# Patient Record
Sex: Male | Born: 1955 | Race: Black or African American | Hispanic: No | Marital: Single | Smoking: Current some day smoker
Health system: Southern US, Community
[De-identification: ages and names within clinical notes are randomized; demographics above are authoritative.]

## PROBLEM LIST (undated history)

## (undated) DIAGNOSIS — K7030 Alcoholic cirrhosis of liver without ascites: Secondary | ICD-10-CM

## (undated) DIAGNOSIS — K649 Unspecified hemorrhoids: Secondary | ICD-10-CM

## (undated) DIAGNOSIS — D696 Thrombocytopenia, unspecified: Secondary | ICD-10-CM

## (undated) DIAGNOSIS — Z720 Tobacco use: Secondary | ICD-10-CM

## (undated) DIAGNOSIS — M1990 Unspecified osteoarthritis, unspecified site: Secondary | ICD-10-CM

## (undated) DIAGNOSIS — K922 Gastrointestinal hemorrhage, unspecified: Secondary | ICD-10-CM

---

## 2019-05-27 ENCOUNTER — Encounter: Payer: Self-pay | Admitting: Emergency Medicine

## 2019-05-27 ENCOUNTER — Emergency Department: Payer: Medicaid - Out of State

## 2019-05-27 ENCOUNTER — Other Ambulatory Visit: Payer: Self-pay

## 2019-05-27 ENCOUNTER — Observation Stay
Admission: EM | Admit: 2019-05-27 | Discharge: 2019-05-28 | Disposition: A | Discharge status: Home / Self Care | Payer: Medicaid - Out of State | Attending: Internal Medicine | Admitting: Internal Medicine

## 2019-05-27 DIAGNOSIS — Z20822 Contact with and (suspected) exposure to covid-19: Secondary | ICD-10-CM | POA: Diagnosis not present

## 2019-05-27 DIAGNOSIS — I10 Essential (primary) hypertension: Secondary | ICD-10-CM | POA: Insufficient documentation

## 2019-05-27 DIAGNOSIS — M5136 Other intervertebral disc degeneration, lumbar region: Secondary | ICD-10-CM | POA: Insufficient documentation

## 2019-05-27 DIAGNOSIS — I8510 Secondary esophageal varices without bleeding: Secondary | ICD-10-CM | POA: Insufficient documentation

## 2019-05-27 DIAGNOSIS — Z79899 Other long term (current) drug therapy: Secondary | ICD-10-CM | POA: Insufficient documentation

## 2019-05-27 DIAGNOSIS — K7030 Alcoholic cirrhosis of liver without ascites: Secondary | ICD-10-CM | POA: Diagnosis not present

## 2019-05-27 DIAGNOSIS — Z720 Tobacco use: Secondary | ICD-10-CM

## 2019-05-27 DIAGNOSIS — F17200 Nicotine dependence, unspecified, uncomplicated: Secondary | ICD-10-CM | POA: Insufficient documentation

## 2019-05-27 DIAGNOSIS — K648 Other hemorrhoids: Principal | ICD-10-CM | POA: Insufficient documentation

## 2019-05-27 DIAGNOSIS — K649 Unspecified hemorrhoids: Secondary | ICD-10-CM | POA: Diagnosis present

## 2019-05-27 DIAGNOSIS — K922 Gastrointestinal hemorrhage, unspecified: Secondary | ICD-10-CM | POA: Diagnosis not present

## 2019-05-27 DIAGNOSIS — R7401 Elevation of levels of liver transaminase levels: Secondary | ICD-10-CM | POA: Insufficient documentation

## 2019-05-27 DIAGNOSIS — I700 Atherosclerosis of aorta: Secondary | ICD-10-CM | POA: Diagnosis not present

## 2019-05-27 LAB — CBC
HCT: 40.1 % (ref 39.0–52.0)
Hemoglobin: 14 g/dL (ref 13.0–17.0)
MCH: 32.3 pg (ref 26.0–34.0)
MCHC: 34.9 g/dL (ref 30.0–36.0)
MCV: 92.6 fL (ref 80.0–100.0)
Platelets: 98 10*3/uL — ABNORMAL LOW (ref 150–400)
RBC: 4.33 MIL/uL (ref 4.22–5.81)
RDW: 16.1 % — ABNORMAL HIGH (ref 11.5–15.5)
WBC: 4.2 10*3/uL (ref 4.0–10.5)
nRBC: 0 % (ref 0.0–0.2)

## 2019-05-27 LAB — HEMOGLOBIN AND HEMATOCRIT, BLOOD
HCT: 39.4 % (ref 39.0–52.0)
Hemoglobin: 13.9 g/dL (ref 13.0–17.0)

## 2019-05-27 LAB — PROTIME-INR
INR: 1.2 (ref 0.8–1.2)
Prothrombin Time: 15.1 seconds (ref 11.4–15.2)

## 2019-05-27 LAB — COMPREHENSIVE METABOLIC PANEL
ALT: 58 U/L — ABNORMAL HIGH (ref 0–44)
AST: 100 U/L — ABNORMAL HIGH (ref 15–41)
Albumin: 3.2 g/dL — ABNORMAL LOW (ref 3.5–5.0)
Alkaline Phosphatase: 76 U/L (ref 38–126)
Anion gap: 6 (ref 5–15)
BUN: 11 mg/dL (ref 8–23)
CO2: 26 mmol/L (ref 22–32)
Calcium: 9.5 mg/dL (ref 8.9–10.3)
Chloride: 104 mmol/L (ref 98–111)
Creatinine, Ser: 0.99 mg/dL (ref 0.61–1.24)
GFR calc Af Amer: >60 mL/min (ref >60)
GFR calc non Af Amer: >60 mL/min (ref >60)
Glucose, Bld: 118 mg/dL — ABNORMAL HIGH (ref 70–99)
Potassium: 3.9 mmol/L (ref 3.5–5.1)
Sodium: 136 mmol/L (ref 135–145)
Total Bilirubin: 1.8 mg/dL — ABNORMAL HIGH (ref 0.3–1.2)
Total Protein: 8 g/dL (ref 6.5–8.1)

## 2019-05-27 LAB — TYPE AND SCREEN
ABO/RH(D): A POS
Antibody Screen: NEGATIVE

## 2019-05-27 MED ORDER — SODIUM CHLORIDE 0.9% FLUSH: 3.0000 mL | Freq: Two times a day (BID) | INTRAVENOUS | Status: DC

## 2019-05-27 MED ORDER — SODIUM CHLORIDE 0.9 % IV SOLN
1.0000 g | Freq: Once | INTRAVENOUS | Status: AC
  Administered 2019-05-27: 1 g via INTRAVENOUS
  Filled 2019-05-27: qty 10

## 2019-05-27 MED ORDER — ONDANSETRON HCL 4 MG/2ML IJ SOLN: 4.0000 mg | Freq: Four times a day (QID) | INTRAMUSCULAR | Status: DC | PRN

## 2019-05-27 MED ORDER — DOCUSATE SODIUM 100 MG PO CAPS: 100.0000 mg | ORAL_CAPSULE | Freq: Two times a day (BID) | ORAL | Status: DC

## 2019-05-27 MED ORDER — PANTOPRAZOLE SODIUM 40 MG IV SOLR: 40.0000 mg | Freq: Two times a day (BID) | INTRAVENOUS | Status: DC

## 2019-05-27 MED ORDER — SODIUM CHLORIDE 0.9 % IV SOLN
50.0000 ug/h | INTRAVENOUS | Status: DC
  Administered 2019-05-27 – 2019-05-28 (×2): 50 ug/h via INTRAVENOUS
  Filled 2019-05-27 (×8): qty 1

## 2019-05-27 MED ORDER — SODIUM CHLORIDE 0.9 % IV SOLN: INTRAVENOUS | Status: DC

## 2019-05-27 MED ORDER — PANTOPRAZOLE SODIUM 40 MG IV SOLR
40.0000 mg | Freq: Once | INTRAVENOUS | Status: AC
  Administered 2019-05-27: 40 mg via INTRAVENOUS
  Filled 2019-05-27: qty 40

## 2019-05-27 MED ORDER — IOHEXOL 300 MG/ML  SOLN
100.0000 mL | Freq: Once | INTRAMUSCULAR | Status: AC | PRN
  Administered 2019-05-27: 100 mL via INTRAVENOUS

## 2019-05-27 MED ORDER — BISACODYL 5 MG PO TBEC
5.0000 mg | DELAYED_RELEASE_TABLET | Freq: Every day | ORAL | Status: DC | PRN
  Administered 2019-05-27: 5 mg via ORAL
  Filled 2019-05-27: qty 1

## 2019-05-27 MED ORDER — ONDANSETRON HCL 4 MG PO TABS: 4.0000 mg | ORAL_TABLET | Freq: Four times a day (QID) | ORAL | Status: DC | PRN

## 2019-05-27 NOTE — H&P (Signed)
[place] at [facility]   PATIENT NAME: Roger Franco    MR#:  [id number]  DATE OF BIRTH:  1955-12-25  DATE OF ADMISSION:  05/27/2019  PRIMARY CARE PHYSICIAN: No primary care provider on file.   REQUESTING/REFERRING PHYSICIAN: Willy Eddy, MD  CHIEF COMPLAINT:   Chief Complaint  Patient presents with  . GI Bleeding    HISTORY OF PRESENT ILLNESS:  Roger Franco  is a 64 y.o. male with a known history of alcohol use being admitted for GI bleed. She presents to the ER for evaluation of 4 bright red bloody stool for the past 24 hours.  States that she had noticed some episodes earlier in the week.  States that 2 weeks ago he was drinking quite a bit of alcohol but has not had a drink of alcohol in 2 weeks.  Also worried that he might have cirrhosis. Does have some left lower quadrant pain associated with this.  Denies any history of hemorrhoids. PAST MEDICAL HISTORY:  History reviewed. No pertinent past medical history.  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 No pertinent surgical history. SOCIAL HISTORY:   Social History   Tobacco Use  . Smoking status: Current Every Day Smoker  . Smokeless tobacco: Never Used  Substance Use Topics  . Alcohol use: Yes   FAMILY HISTORY:  No family history on file.  Denies any family medical issue DRUG ALLERGIES:  No Known Allergies REVIEW OF SYSTEMS:  Review of Systems  Constitutional: Negative for diaphoresis, fever, malaise/fatigue and weight loss.  HENT: Negative for ear discharge, ear pain, hearing loss, nosebleeds, sore throat and tinnitus.   Eyes: Negative for blurred vision and pain.  Respiratory: Negative for cough, hemoptysis, shortness of breath and wheezing.   Cardiovascular: Negative for chest pain, palpitations, orthopnea and leg swelling.  Gastrointestinal: Positive for blood in stool. Negative for abdominal pain, constipation, diarrhea, heartburn, nausea and vomiting.  Genitourinary:  Negative for dysuria, frequency and urgency.  Musculoskeletal: Negative for back pain and myalgias.  Skin: Negative for itching and rash.  Neurological: Negative for dizziness, tingling, tremors, focal weakness, seizures, weakness and headaches.  Psychiatric/Behavioral: Negative for depression. The patient is not nervous/anxious.    MEDICATIONS AT HOME:   Prior to Admission medications   Not on File  Takes gabapentin and oxycontin if and when he needs although he couldn't tell me who prescribes him this  VITAL SIGNS:  Blood pressure 120/63, pulse (!) 59, temperature 98.2 F (36.8 C), temperature source Oral, resp. rate 20, SpO2 97 %. PHYSICAL EXAMINATION:  Physical Exam  GENERAL:  64 y.o.-year-old patient lying in the bed with no acute distress.  EYES: Pupils equal, round, reactive to light and accommodation. No scleral icterus. Extraocular muscles intact.  HEENT: Head atraumatic, normocephalic. Oropharynx and nasopharynx clear.  NECK:  Supple, no jugular venous distention. No thyroid enlargement, no tenderness.  LUNGS: Normal breath sounds bilaterally, no wheezing, rales,rhonchi or crepitation. No use of accessory muscles of respiration.  CARDIOVASCULAR: S1, S2 normal. No murmurs, rubs, or gallops.  ABDOMEN: Soft, nontender, nondistended. Bowel sounds present. No organomegaly or mass.  EXTREMITIES: No pedal edema, cyanosis, or clubbing.  NEUROLOGIC: Cranial nerves II through XII are intact. Muscle strength 5/5 in all extremities. Sensation intact. Gait not checked.  PSYCHIATRIC: The patient is alert and oriented x 3.  SKIN: No obvious rash, lesion, or ulcer.  LABORATORY PANEL:   CBC Recent Labs  Lab 05/27/19 1109  WBC 4.2  HGB 14.0  HCT 40.1  PLT 98*   ------------------------------------------------------------------------------------------------------------------  Chemistries  Recent Labs  Lab 05/27/19 1109  NA 136  K 3.9  CL 104  CO2 26  GLUCOSE 118*  BUN 11    CREATININE 0.99  CALCIUM 9.5  AST 100*  ALT 58*  ALKPHOS 76  BILITOT 1.8*   ------------------------------------------------------------------------------------------------------------------  Cardiac Enzymes No results for input(s): TROPONINI in the last 168 hours. ------------------------------------------------------------------------------------------------------------------  RADIOLOGY:  CT ABDOMEN PELVIS W CONTRAST  Result Date: 05/27/2019 CLINICAL DATA:  Bright red bloody stool for several days. EXAM: CT ABDOMEN AND PELVIS WITH CONTRAST TECHNIQUE: Multidetector CT imaging of the abdomen and pelvis was performed using the standard protocol following bolus administration of intravenous contrast. CONTRAST:  166mL OMNIPAQUE IOHEXOL 300 MG/ML  SOLN COMPARISON:  None. FINDINGS: Lower chest: Normal. Hepatobiliary: The liver contour is irregular. No focal liver lesions. Biliary tree is normal. Pancreas: Pancreatic duct is slightly prominent in the proximal body to a diameter 4 mm. No mass. The pancreas is otherwise normal. Spleen: Normal. Adrenals/Urinary Tract: Adrenal glands are unremarkable. Kidneys are normal, without renal calculi, focal lesion, or hydronephrosis. Bladder is unremarkable. Stomach/Bowel: There are small varices around the distal esophagus and adjacent to the lesser curvature of the stomach. The stomach and small bowel are otherwise normal. There are prominent enhancing vessels in the rectum which may represent hemorrhoids. The colon otherwise appears normal including the appendix. Vascular/Lymphatic: Slight aortic atherosclerosis. No adenopathy. Reproductive: Prostate is unremarkable. Other: No abdominal wall hernia or abnormality. No abdominopelvic ascites. Musculoskeletal: Degenerative disc disease in the lower lumbar spine with small disc protrusions at L4-5 and L5-S1. IMPRESSION: 1. Prominent enhancing vessels in the rectum which may represent hemorrhoids. I cannot exclude a  brightly enhancing vascular mucosal mass in the rectum a 2. Irregular liver contour consistent with cirrhosis. 3. Small varices around the distal esophagus and adjacent to the lesser curvature of the stomach. 4. Aortic atherosclerosis. Aortic Atherosclerosis (ICD10-I70.0). Electronically Signed   By: Lorriane Shire M.D.   On: 05/27/2019 17:05   IMPRESSION AND PLAN:  66 y f being admitted for GI bleed  * GI bleed/BRBPR - protonix IV bid and octerotide drip - GI c/s - H & H Q 8 hrs - monitor in progressive care - hemodynamically stable - NPO for now if GI decides for luminal eval  * Elevated LFTs AST > ALT consistent with alcoholic liver dz  * Alcoholic liver dz/cirrhosis Counseled     All the records are reviewed and case discussed with ED provider. Management plans discussed with the patient, nursing and they are in agreement.  CODE STATUS: FULL CODE  TOTAL TIME TAKING CARE OF THIS PATIENT: 45 minutes.    Max Sane M.D on 05/27/2019 at 5:54 PM  [facility]   CC: Primary care physician; No primary care provider on file.   Note: This dictation was prepared with Dragon dictation along with smaller phrase technology. Any transcriptional errors that result from this process are unintentional.

## 2019-05-27 NOTE — ED Notes (Signed)
Pt given juice with verbal okay from provider Sherryll Burger.

## 2019-05-27 NOTE — ED Notes (Signed)
Pt given non-slip socks. Pt up to bedside toilet.

## 2019-05-27 NOTE — ED Notes (Signed)
Pt resting calmly in bed. Watching tv. Bed locked low. Rail up. Call bell within reach.

## 2019-05-27 NOTE — ED Triage Notes (Signed)
Patient reports 4 episodes of bright red bloody stool over the past few days. Patient states he was drinking alcohol when the first episode occurs. Denies N/V.

## 2019-05-27 NOTE — ED Provider Notes (Signed)
[facility] Emergency Department Provider Note    First MD Initiated Contact with Patient 05/27/19 1739     (approximate)  I have reviewed the triage vital signs and the nursing notes.   HISTORY  Chief Complaint GI Bleeding    HPI Roger Franco is a 64 y.o. male with history of alcohol use presents to the ER for evaluation of bright red bloody stool for the past 24 hours.  States that she had noticed some episodes earlier in the week.  States that 2 weeks ago he was drinking quite a bit of alcohol but has not had a drink of alcohol in 2 weeks.  Also worried that he might have cirrhosis.  Is never been worked up for this.  Does smoke.  Does have some left lower quadrant pain associated with this.  Denies any history of hemorrhoids.    History reviewed. No pertinent past medical history. No family history on file. History reviewed. No pertinent surgical history. Patient Active Problem List   Diagnosis Date Noted  . GI bleed 05/27/2019      Prior to Admission medications   Not on File    Allergies Patient has no known allergies.    Social History Social History   Tobacco Use  . Smoking status: Current Every Day Smoker  . Smokeless tobacco: Never Used  Substance Use Topics  . Alcohol use: Yes  . Drug use: Not Currently    Review of Systems Patient denies headaches, rhinorrhea, blurry vision, numbness, shortness of breath, chest pain, edema, cough, abdominal pain, nausea, vomiting, diarrhea, dysuria, fevers, rashes or hallucinations unless otherwise stated above in HPI. ____________________________________________   PHYSICAL EXAM:  VITAL SIGNS: Vitals:   05/27/19 1110 05/27/19 1534  BP: 120/63   Pulse: (!) 59   Resp: 20   Temp: 98.2 F (36.8 C)   SpO2: 100% 97%    Constitutional: Alert and oriented.  Eyes: Conjunctivae are normal.  Head: Atraumatic. Nose: No congestion/rhinnorhea. Mouth/Throat: Mucous membranes are moist.    Neck: No stridor. Painless ROM.  Cardiovascular: Normal rate, regular rhythm. Grossly normal heart sounds.  Good peripheral circulation. Respiratory: Normal respiratory effort.  No retractions. Lungs CTAB. Gastrointestinal: Soft and nontender. No distention. No abdominal bruits. No CVA tenderness. Genitourinary: Small nonthrombosed nonbleeding hemorrhoids no evidence of anal fissure.  DRE with dark stool no evidence of blood but it is guaiac positive Musculoskeletal: No lower extremity tenderness nor edema.  No joint effusions. Neurologic:  Normal speech and language. No gross focal neurologic deficits are appreciated. No facial droop Skin:  Skin is warm, dry and intact. No rash noted. Psychiatric: Mood and affect are normal. Speech and behavior are normal.  ____________________________________________   LABS (all labs ordered are listed, but only abnormal results are displayed)  Results for orders placed or performed during the hospital encounter of 05/27/19 (from the past 24 hour(s))  Comprehensive metabolic panel     Status: Abnormal   Collection Time: 05/27/19 11:09 AM  Result Value Ref Range   Sodium 136 135 - 145 mmol/L   Potassium 3.9 3.5 - 5.1 mmol/L   Chloride 104 98 - 111 mmol/L   CO2 26 22 - 32 mmol/L   Glucose, Bld 118 (H) 70 - 99 mg/dL   BUN 11 8 - 23 mg/dL   Creatinine, Ser 0.10 0.61 - 1.24 mg/dL   Calcium 9.5 8.9 - 27.2 mg/dL   Total Protein 8.0 6.5 - 8.1 g/dL   Albumin 3.2 (L) 3.5 -  5.0 g/dL   AST 100 (H) 15 - 41 U/L   ALT 58 (H) 0 - 44 U/L   Alkaline Phosphatase 76 38 - 126 U/L   Total Bilirubin 1.8 (H) 0.3 - 1.2 mg/dL   GFR calc non Af Amer >60 >60 mL/min   GFR calc Af Amer >60 >60 mL/min   Anion gap 6 5 - 15  CBC     Status: Abnormal   Collection Time: 05/27/19 11:09 AM  Result Value Ref Range   WBC 4.2 4.0 - 10.5 K/uL   RBC 4.33 4.22 - 5.81 MIL/uL   Hemoglobin 14.0 13.0 - 17.0 g/dL   HCT 40.1 39.0 - 52.0 %   MCV 92.6 80.0 - 100.0 fL   MCH 32.3 26.0 -  34.0 pg   MCHC 34.9 30.0 - 36.0 g/dL   RDW 16.1 (H) 11.5 - 15.5 %   Platelets 98 (L) 150 - 400 K/uL   nRBC 0.0 0.0 - 0.2 %  Type and screen [facility]     Status: None   Collection Time: 05/27/19 11:09 AM  Result Value Ref Range   ABO/RH(D) A POS    Antibody Screen NEG    Sample Expiration      05/30/2019,2359 Performed at [facility], [address] [place], [place] [address]    ____________________________________________ ____________________________________________  RADIOLOGY  I personally reviewed all radiographic images ordered to evaluate for the above acute complaints and reviewed radiology reports and findings.  These findings were personally discussed with the patient.  Please see medical record for radiology report.  ____________________________________________   PROCEDURES  Procedure(s) performed:  Procedures    Critical Care performed: no ____________________________________________   INITIAL IMPRESSION / ASSESSMENT AND PLAN / ED COURSE  Pertinent labs & imaging results that were available during my care of the patient were reviewed by me and considered in my medical decision making (see chart for details).   DDX: GI bleed, varices, fissure, hemorrhoid, diverticulosis, mass  Roger Franco is a 64 y.o. who presents to the ED with symptoms as described above.  Patient is hemodynamically stable.  Blood work stable but does show low platelets.  CT imaging ordered given his pain to evaluate for diverticulosis but does show evidence of cirrhotic stigmata.  Given his guaiac positive stool and concern for bleeding was given Protonix as well as octreotide.  Discussed case with hospitalist as well as GI, Dr.Anna.  Will be given antibiotic prophylaxis.     The patient was evaluated in Emergency Department today for the symptoms described in the history of present illness. He/she was evaluated in the context of the global COVID-19  pandemic, which necessitated consideration that the patient might be at risk for infection with the SARS-CoV-2 virus that causes COVID-19. Institutional protocols and algorithms that pertain to the evaluation of patients at risk for COVID-19 are in a state of rapid change based on information released by regulatory bodies including the CDC and federal and state organizations. These policies and algorithms were followed during the patient's care in the ED.  As part of my medical decision making, I reviewed the following data within the [place] notes reviewed and incorporated, Labs reviewed, notes from prior ED visits and [place] Controlled Substance Database   ____________________________________________   FINAL CLINICAL IMPRESSION(S) / ED DIAGNOSES  Final diagnoses:  Gastrointestinal hemorrhage, unspecified gastrointestinal hemorrhage type      NEW MEDICATIONS STARTED DURING THIS VISIT:  New Prescriptions  No medications on file     Note:  This document was prepared using Dragon voice recognition software and may include unintentional dictation errors.    Willy Eddy, MD 05/27/19 [phone]

## 2019-05-27 NOTE — ED Notes (Signed)
This RN at bedside to assist EDP Roxan Hockey with rectal exam.

## 2019-05-27 NOTE — ED Notes (Signed)
Pt given pillow. HOB adjusted for pt. Denies any other needs currently. Bed locked low. Rail up. Call bell within reach.

## 2019-05-28 DIAGNOSIS — K649 Unspecified hemorrhoids: Secondary | ICD-10-CM | POA: Diagnosis present

## 2019-05-28 DIAGNOSIS — F1010 Alcohol abuse, uncomplicated: Secondary | ICD-10-CM

## 2019-05-28 DIAGNOSIS — K922 Gastrointestinal hemorrhage, unspecified: Secondary | ICD-10-CM | POA: Diagnosis present

## 2019-05-28 DIAGNOSIS — K7030 Alcoholic cirrhosis of liver without ascites: Secondary | ICD-10-CM | POA: Diagnosis present

## 2019-05-28 DIAGNOSIS — Z720 Tobacco use: Secondary | ICD-10-CM

## 2019-05-28 LAB — CBC
HCT: 35.3 % — ABNORMAL LOW (ref 39.0–52.0)
Hemoglobin: 12.9 g/dL — ABNORMAL LOW (ref 13.0–17.0)
MCH: 32.2 pg (ref 26.0–34.0)
MCHC: 36.5 g/dL — ABNORMAL HIGH (ref 30.0–36.0)
MCV: 88 fL (ref 80.0–100.0)
Platelets: 90 10*3/uL — ABNORMAL LOW (ref 150–400)
RBC: 4.01 MIL/uL — ABNORMAL LOW (ref 4.22–5.81)
RDW: 15.9 % — ABNORMAL HIGH (ref 11.5–15.5)
WBC: 4.9 10*3/uL (ref 4.0–10.5)
nRBC: 0 % (ref 0.0–0.2)

## 2019-05-28 LAB — BASIC METABOLIC PANEL
Anion gap: 5 (ref 5–15)
BUN: 9 mg/dL (ref 8–23)
CO2: 24 mmol/L (ref 22–32)
Calcium: 8.9 mg/dL (ref 8.9–10.3)
Chloride: 105 mmol/L (ref 98–111)
Creatinine, Ser: 0.83 mg/dL (ref 0.61–1.24)
GFR calc Af Amer: >60 mL/min (ref >60)
GFR calc non Af Amer: >60 mL/min (ref >60)
Glucose, Bld: 128 mg/dL — ABNORMAL HIGH (ref 70–99)
Potassium: 4.2 mmol/L (ref 3.5–5.1)
Sodium: 134 mmol/L — ABNORMAL LOW (ref 135–145)

## 2019-05-28 LAB — HIV ANTIBODY (ROUTINE TESTING W REFLEX): HIV Screen 4th Generation wRfx: NONREACTIVE

## 2019-05-28 LAB — HEMOGLOBIN AND HEMATOCRIT, BLOOD
HCT: 36.3 % — ABNORMAL LOW (ref 39.0–52.0)
Hemoglobin: 12.7 g/dL — ABNORMAL LOW (ref 13.0–17.0)

## 2019-05-28 LAB — SARS CORONAVIRUS 2 (TAT 6-24 HRS): SARS Coronavirus 2: NEGATIVE

## 2019-05-28 LAB — PROTIME-INR
INR: 1.2 (ref 0.8–1.2)
Prothrombin Time: 15.5 seconds — ABNORMAL HIGH (ref 11.4–15.2)

## 2019-05-28 LAB — HEPATITIS PANEL, ACUTE
HCV Ab: REACTIVE — AB
Hep A IgM: NONREACTIVE
Hep B C IgM: NONREACTIVE
Hepatitis B Surface Ag: NONREACTIVE

## 2019-05-28 NOTE — Discharge Summary (Addendum)
Physician Discharge Summary  Roger Franco YNW:[id number] DOB: 1955/07/04 DOA: 05/27/2019  PCP: System, Pcp Not In  Admit date: 05/27/2019 Discharge date: 05/28/2019  Admitted From: Home Disposition: Home  Recommendations for Outpatient Follow-up:  Follow-up with PCP in [place] in 1-2 weeks.   Home Health: None Equipment/Devices: None  Discharge Condition: Fair CODE STATUS: Full code Diet recommendation: Regular   Discharge Diagnoses:  Principal Problem:   Bleeding hemorrhoid, ruled in  Active Problems: Acute lower GI bleed   Alcoholic cirrhosis of liver without ascites (HCC)   Tobacco use  Brief narrative/HPI 64 year old with hypertension, ongoing tobacco use and heavy alcohol use who gets his primary care in [place] and currently visiting his daughter presented with multiple episodes of bright red blood mixed with stool for 1 day duration.  Reports that he has not had alcohol in 2 weeks.  It is an H&H stable with stool for Hemoccult positive. Placed on IV PPI and octreotide drip and admitted to hospital service.  Hospital course  Principal problem Acute lower GI bleed, secondary to bleeding internal hemorrhoids Bleeding internal hemorrhoids, ruled in based on CT finding of enhancing vessels in the rectum likely representing hemorrhoid.  Also has small varices around distal esophagus and cirrhotic changes in the liver.  Seen by GI and suspect this is likely hemorrhoidal bleeding, recommend Patient is due for colonoscopy and stable to be discharged home to follow-up with his PCP regarding his hemorrhoids..  No further bleeding episode and H&H stable.     Active problems  Transaminitis (AST >ALT) Mild transaminitis.  CT abdomen with cirrhotic changes.   alcohol abuse No signs of withdrawal.  Counseled strongly on cessation.  Tobacco use Counseled on cessation     Discharge Instructions   Allergies as of 05/28/2019   No Known Allergies     Medication  List    You have not been prescribed any medications.    Follow-up Information    Follow-up with PCP in [place] Follow up.          No Known Allergies    Procedures/Studies: CT ABDOMEN PELVIS W CONTRAST  Result Date: 05/27/2019 CLINICAL DATA:  Bright red bloody stool for several days. EXAM: CT ABDOMEN AND PELVIS WITH CONTRAST TECHNIQUE: Multidetector CT imaging of the abdomen and pelvis was performed using the standard protocol following bolus administration of intravenous contrast. CONTRAST:  OMNIPAQUE IOHEXOL 300 MG/ML  SOLN COMPARISON:  None. FINDINGS: Lower chest: Normal. Hepatobiliary: The liver contour is irregular. No focal liver lesions. Biliary tree is normal. Pancreas: Pancreatic duct is slightly prominent in the proximal body to a diameter 4 mm. No mass. The pancreas is otherwise normal. Spleen: Normal. Adrenals/Urinary Tract: Adrenal glands are unremarkable. Kidneys are normal, without renal calculi, focal lesion, or hydronephrosis. Bladder is unremarkable. Stomach/Bowel: There are small varices around the distal esophagus and adjacent to the lesser curvature of the stomach. The stomach and small bowel are otherwise normal. There are prominent enhancing vessels in the rectum which may represent hemorrhoids. The colon otherwise appears normal including the appendix. Vascular/Lymphatic: Slight aortic atherosclerosis. No adenopathy. Reproductive: Prostate is unremarkable. Other: No abdominal wall hernia or abnormality. No abdominopelvic ascites. Musculoskeletal: Degenerative disc disease in the lower lumbar spine with small disc protrusions at L4-5 and L5-S1. IMPRESSION: 1. Prominent enhancing vessels in the rectum which may represent hemorrhoids. I cannot exclude a brightly enhancing vascular mucosal mass in the rectum a 2. Irregular liver contour consistent with cirrhosis. 3. Small varices around the distal esophagus and  adjacent to the lesser curvature of the stomach. 4. Aortic  atherosclerosis. Aortic Atherosclerosis (ICD10-I70.0). Electronically Signed   By: Lorriane Shire M.D.   On: 05/27/2019 17:05      Subjective:  No further rectal bleed.  Discharge Exam: Vitals:   05/28/19 1230 05/28/19 1330  BP: 121/81 131/73  Pulse: (!) 56 (!) 58  Resp: 18   Temp:    SpO2: 93% 96%   Vitals:   05/28/19 1130 05/28/19 1200 05/28/19 1230 05/28/19 1330  BP: 132/82 119/77 121/81 131/73  Pulse: (!) 53 (!) 56 (!) 56 (!) 58  Resp: 13 15 18    Temp:      TempSrc:      SpO2: 98% 96% 93% 96%    General: Middle-aged male not in distress HEENT: No pallor, no icterus, moist mucosa, supple neck Chest: Clear bilaterally CVs: Normal S1-S2 GI: Soft, nondistended, nontender Musculoskeletal: Warm, no edema    The results of significant diagnostics from this hospitalization (including imaging, microbiology, ancillary and laboratory) are listed below for reference.     Microbiology: Recent Results (from the past 240 hour(s))  SARS CORONAVIRUS 2 (TAT 6-24 HRS) Nasopharyngeal Nasopharyngeal Swab     Status: None   Collection Time: 05/27/19  6:16 PM   Specimen: Nasopharyngeal Swab  Result Value Ref Range Status   SARS Coronavirus 2 NEGATIVE NEGATIVE Final    Comment: (NOTE) SARS-CoV-2 target nucleic acids are NOT DETECTED. The SARS-CoV-2 RNA is generally detectable in upper and lower respiratory specimens during the acute phase of infection. Negative results do not preclude SARS-CoV-2 infection, do not rule out co-infections with other pathogens, and should not be used as the sole basis for treatment or other patient management decisions. Negative results must be combined with clinical observations, patient history, and epidemiological information. The expected result is Negative. Fact Sheet for Patients: SugarRoll.be Fact Sheet for Healthcare Providers: https://www.woods-mathews.com/ This test is not yet approved or cleared  by the [place] FDA and  has been authorized for detection and/or diagnosis of SARS-CoV-2 by FDA under an Emergency Use Authorization (EUA). This EUA will remain  in effect (meaning this test can be used) for the duration of the COVID-19 declaration under Section 56 4(b)(1) of the Act, 21 U.S.C. section 360bbb-3(b)(1), unless the authorization is terminated or revoked sooner. Performed at [facility], [place] [address] [place], [place] [address]      Labs: BNP (last 3 results) No results for input(s): BNP in the last 8760 hours. Basic Metabolic Panel: Recent Labs  Lab 05/27/19 1109 05/28/19 0535  NA 136 134*  K 3.9 4.2  CL 104 105  CO2 26 24  GLUCOSE 118* 128*  BUN 11 9  CREATININE 0.99 0.83  CALCIUM 9.5 8.9   Liver Function Tests: Recent Labs  Lab 05/27/19 1109  AST 100*  ALT 58*  ALKPHOS 76  BILITOT 1.8*  PROT 8.0  ALBUMIN 3.2*   No results for input(s): LIPASE, AMYLASE in the last 168 hours. No results for input(s): AMMONIA in the last 168 hours. CBC: Recent Labs  Lab 05/27/19 1109 05/27/19 1816 05/28/19 0151 05/28/19 0535  WBC 4.2  --   --  4.9  HGB 14.0 13.9 12.7* 12.9*  HCT 40.1 39.4 36.3* 35.3*  MCV 92.6  --   --  88.0  PLT 98*  --   --  90*   Cardiac Enzymes: No results for input(s): CKTOTAL, CKMB, CKMBINDEX, TROPONINI in the last 168 hours. BNP: Invalid input(s): POCBNP  CBG: No results for input(s): GLUCAP in the last 168 hours. D-Dimer No results for input(s): DDIMER in the last 72 hours. Hgb A1c No results for input(s): HGBA1C in the last 72 hours. Lipid Profile No results for input(s): CHOL, HDL, LDLCALC, TRIG, CHOLHDL, LDLDIRECT in the last 72 hours. Thyroid function studies No results for input(s): TSH, T4TOTAL, T3FREE, THYROIDAB in the last 72 hours.  Invalid input(s): FREET3 Anemia work up No results for input(s): VITAMINB12, FOLATE, FERRITIN, TIBC, IRON, RETICCTPCT in the last 72 hours. Urinalysis No results found  for: COLORURINE, APPEARANCEUR, LABSPEC, PHURINE, GLUCOSEU, HGBUR, BILIRUBINUR, KETONESUR, PROTEINUR, UROBILINOGEN, NITRITE, LEUKOCYTESUR Sepsis Labs Invalid input(s): PROCALCITONIN,  WBC,  LACTICIDVEN Microbiology Recent Results (from the past 240 hour(s))  SARS CORONAVIRUS 2 (TAT 6-24 HRS) Nasopharyngeal Nasopharyngeal Swab     Status: None   Collection Time: 05/27/19  6:16 PM   Specimen: Nasopharyngeal Swab  Result Value Ref Range Status   SARS Coronavirus 2 NEGATIVE NEGATIVE Final    Comment: (NOTE) SARS-CoV-2 target nucleic acids are NOT DETECTED. The SARS-CoV-2 RNA is generally detectable in upper and lower respiratory specimens during the acute phase of infection. Negative results do not preclude SARS-CoV-2 infection, do not rule out co-infections with other pathogens, and should not be used as the sole basis for treatment or other patient management decisions. Negative results must be combined with clinical observations, patient history, and epidemiological information. The expected result is Negative. Fact Sheet for Patients: HairSlick.no Fact Sheet for Healthcare Providers: quierodirigir.com This test is not yet approved or cleared by the [place] FDA and  has been authorized for detection and/or diagnosis of SARS-CoV-2 by FDA under an Emergency Use Authorization (EUA). This EUA will remain  in effect (meaning this test can be used) for the duration of the COVID-19 declaration under Section 56 4(b)(1) of the Act, 21 U.S.C. section 360bbb-3(b)(1), unless the authorization is terminated or revoked sooner. Performed at [facility], [address], [place], [place] [address]      Time coordinating discharge: 25 minutes  SIGNED:   Eddie North, MD  [facility] 05/28/2019, 2:43 PM Pager   If 7PM-7AM, please contact night-coverage [email or web address] Password [email or web address]

## 2019-05-28 NOTE — ED Notes (Signed)
Ready bed @ 1429, patient going to room 113

## 2019-05-28 NOTE — Progress Notes (Signed)
PROGRESS NOTE    Cha Kabel  WCB:[id number] DOB: 1955/11/17 DOA: 05/27/2019 PCP: System, Pcp Not In (Confirm with patient/family/NH records and if not entered, this HAS to be entered at [facility] point of entry. "No PCP" if truly none.)   Chief Complaint  Patient presents with  . GI Bleeding    Brief Narrative: 64 year old with hypertension, ongoing tobacco use and heavy alcohol use who gets his primary care in [place] and currently visiting his daughter presented with multiple episodes of bright red blood mixed with stool for 1 day duration.  Reports that he has not had alcohol in 2 weeks.  It is an H&H stable with stool for Hemoccult positive. Placed on IV PPI and octreotide drip and admitted to hospital service.   Assessment & Plan:   Acute GI bleed (unspecified upper versus lower) On IV Protonix twice daily and octreotide drip.  H&H every 8 hours (currently stable).  Has remained NPO.  GI consult pending.  Counseled on alcohol and tobacco cessation. CT abdomen on admission showing cirrhotic changes and  small varices around distal esophagus.  Also comments on prominent enhancing vessels in the rectum which may represent hemorrhoid.  (Cannot rule out enhancing mucosal mass in the rectum)    Transaminitis (AST >ALT) Monitor LFTs closely.  CT abdomen with cirrhotic changes.   alcohol abuse No signs of withdrawal.  Counseled strongly on cessation.  Tobacco use Counseled on cessation   DVT prophylaxis: SCD Code Status: Full code Family Communication: None Disposition: Home pending GI evaluation  Status is: Inpatient  Remains inpatient appropriate because:Hemodynamically unstable and Ongoing diagnostic testing needed not appropriate for outpatient work up   Dispo: The patient is from: Home              Anticipated d/c is to: Home              Anticipated d/c date is: 1 day              Patient currently is not medically stable to d/c.        Consultants:    GI   Procedures: CT abdomen and pelvis      Subjective: Patient frustrated being n.p.o.  No further blood per rectum  Objective: Vitals:   05/28/19 0830 05/28/19 0900 05/28/19 0930 05/28/19 1122  BP: (!) 93/57 121/70 117/71 132/88  Pulse: (!) 55 (!) 54 (!) 53 (!) 55  Resp:  12 10 20   Temp:      TempSrc:      SpO2: 95% 94% 98% 100%   No intake or output data in the 24 hours ending 05/28/19 1338 There were no vitals filed for this visit.  Examination:  General: Middle-aged male not in distress HEENT: No pallor, no icterus, moist mucosa Chest: Clear CVs: Normal S1-S2 GI: Soft, nondistended, nontender Musculoskeletal: Warm, no edema    Data Reviewed: I have personally reviewed following labs and imaging studies  CBC: Recent Labs  Lab 05/27/19 1109 05/27/19 1816 05/28/19 0151 05/28/19 0535  WBC 4.2  --   --  4.9  HGB 14.0 13.9 12.7* 12.9*  HCT 40.1 39.4 36.3* 35.3*  MCV 92.6  --   --  88.0  PLT 98*  --   --  90*    Basic Metabolic Panel: Recent Labs  Lab 05/27/19 1109 05/28/19 0535  NA 136 134*  K 3.9 4.2  CL 104 105  CO2 26 24  GLUCOSE 118* 128*  BUN 11 9  CREATININE 0.99  0.83  CALCIUM 9.5 8.9    GFR: CrCl cannot be calculated (Unknown ideal weight.).  Liver Function Tests: Recent Labs  Lab 05/27/19 1109  AST 100*  ALT 58*  ALKPHOS 76  BILITOT 1.8*  PROT 8.0  ALBUMIN 3.2*    CBG: No results for input(s): GLUCAP in the last 168 hours.   Recent Results (from the past 240 hour(s))  SARS CORONAVIRUS 2 (TAT 6-24 HRS) Nasopharyngeal Nasopharyngeal Swab     Status: None   Collection Time: 05/27/19  6:16 PM   Specimen: Nasopharyngeal Swab  Result Value Ref Range Status   SARS Coronavirus 2 NEGATIVE NEGATIVE Final    Comment: (NOTE) SARS-CoV-2 target nucleic acids are NOT DETECTED. The SARS-CoV-2 RNA is generally detectable in upper and lower respiratory specimens during the acute phase of infection. Negative results do not  preclude SARS-CoV-2 infection, do not rule out co-infections with other pathogens, and should not be used as the sole basis for treatment or other patient management decisions. Negative results must be combined with clinical observations, patient history, and epidemiological information. The expected result is Negative. Fact Sheet for Patients: HairSlick.no Fact Sheet for Healthcare Providers: quierodirigir.com This test is not yet approved or cleared by the [place] FDA and  has been authorized for detection and/or diagnosis of SARS-CoV-2 by FDA under an Emergency Use Authorization (EUA). This EUA will remain  in effect (meaning this test can be used) for the duration of the COVID-19 declaration under Section 56 4(b)(1) of the Act, 21 U.S.C. section 360bbb-3(b)(1), unless the authorization is terminated or revoked sooner. Performed at [facility], [address], [place], [place] [address]          Radiology Studies: CT ABDOMEN PELVIS W CONTRAST  Result Date: 05/27/2019 CLINICAL DATA:  Bright red bloody stool for several days. EXAM: CT ABDOMEN AND PELVIS WITH CONTRAST TECHNIQUE: Multidetector CT imaging of the abdomen and pelvis was performed using the standard protocol following bolus administration of intravenous contrast. CONTRAST:  OMNIPAQUE IOHEXOL 300 MG/ML  SOLN COMPARISON:  None. FINDINGS: Lower chest: Normal. Hepatobiliary: The liver contour is irregular. No focal liver lesions. Biliary tree is normal. Pancreas: Pancreatic duct is slightly prominent in the proximal body to a diameter 4 mm. No mass. The pancreas is otherwise normal. Spleen: Normal. Adrenals/Urinary Tract: Adrenal glands are unremarkable. Kidneys are normal, without renal calculi, focal lesion, or hydronephrosis. Bladder is unremarkable. Stomach/Bowel: There are small varices around the distal esophagus and adjacent to the lesser curvature of  the stomach. The stomach and small bowel are otherwise normal. There are prominent enhancing vessels in the rectum which may represent hemorrhoids. The colon otherwise appears normal including the appendix. Vascular/Lymphatic: Slight aortic atherosclerosis. No adenopathy. Reproductive: Prostate is unremarkable. Other: No abdominal wall hernia or abnormality. No abdominopelvic ascites. Musculoskeletal: Degenerative disc disease in the lower lumbar spine with small disc protrusions at L4-5 and L5-S1. IMPRESSION: 1. Prominent enhancing vessels in the rectum which may represent hemorrhoids. I cannot exclude a brightly enhancing vascular mucosal mass in the rectum a 2. Irregular liver contour consistent with cirrhosis. 3. Small varices around the distal esophagus and adjacent to the lesser curvature of the stomach. 4. Aortic atherosclerosis. Aortic Atherosclerosis (ICD10-I70.0). Electronically Signed   By: Francene Boyers M.D.   On: 05/27/2019 17:05        Scheduled Meds: . docusate sodium  100 mg Oral BID  . pantoprazole (PROTONIX) IV  40 mg Intravenous Q12H  . sodium chloride flush  3 mL Intravenous Q12H   Continuous Infusions: . sodium chloride 75 mL/hr at 05/28/19 0813  . octreotide  (SANDOSTATIN)    IV infusion 50 mcg/hr (05/28/19 0536)     LOS: 1 day    Time spent: 25 minutes    Kelsey Durflinger, MD [facility]   To contact the attending provider between 7A-7P or the covering provider during after hours 7P-7A, please log into the web site [email or web address] and access using universal [place] password for that web site. If you do not have the password, please call the hospital operator.  05/28/2019, 1:38 PM

## 2019-05-28 NOTE — Discharge Instructions (Signed)
Hemorrhoids Hemorrhoids are swollen veins that may develop:  In the butt (rectum). These are called internal hemorrhoids.  Around the opening of the butt (anus). These are called external hemorrhoids. Hemorrhoids can cause pain, itching, or bleeding. Most of the time, they do not cause serious problems. They usually get better with diet changes, lifestyle changes, and other home treatments. What are the causes? This condition may be caused by:  Having trouble pooping (constipation).  Pushing hard (straining) to poop.  Watery poop (diarrhea).  Pregnancy.  Being very overweight (obese).  Sitting for long periods of time.  Heavy lifting or other activity that causes you to strain.  Anal sex.  Riding a bike for a long period of time. What are the signs or symptoms? Symptoms of this condition include:  Pain.  Itching or soreness in the butt.  Bleeding from the butt.  Leaking poop.  Swelling in the area.  One or more lumps around the opening of your butt. How is this diagnosed? A doctor can often diagnose this condition by looking at the affected area. The doctor may also:  Do an exam that involves feeling the area with a gloved hand (digital rectal exam).  Examine the area inside your butt using a small tube (anoscope).  Order blood tests. This may be done if you have lost a lot of blood.  Have you get a test that involves looking inside the colon using a flexible tube with a camera on the end (sigmoidoscopy or colonoscopy). How is this treated? This condition can usually be treated at home. Your doctor may tell you to change what you eat, make lifestyle changes, or try home treatments. If these do not help, procedures can be done to remove the hemorrhoids or make them smaller. These may involve:  Placing rubber bands at the base of the hemorrhoids to cut off their blood supply.  Injecting medicine into the hemorrhoids to shrink them.  Shining a type of light  energy onto the hemorrhoids to cause them to fall off.  Doing surgery to remove the hemorrhoids or cut off their blood supply. Follow these instructions at home: Eating and drinking   Eat foods that have a lot of fiber in them. These include whole grains, beans, nuts, fruits, and vegetables.  Ask your doctor about taking products that have added fiber (fibersupplements).  Reduce the amount of fat in your diet. You can do this by: ? Eating low-fat dairy products. ? Eating less red meat. ? Avoiding processed foods.  Drink enough fluid to keep your pee (urine) pale yellow. Managing pain and swelling   Take a warm-water bath (sitz bath) for 20 minutes to ease pain. Do this 3-4 times a day. You may do this in a bathtub or using a portable sitz bath that fits over the toilet.  If told, put ice on the painful area. It may be helpful to use ice between your warm baths. ? Put ice in a plastic bag. ? Place a towel between your skin and the bag. ? Leave the ice on for 20 minutes, 2-3 times a day. General instructions  Take over-the-counter and prescription medicines only as told by your doctor. ? Medicated creams and medicines may be used as told.  Exercise often. Ask your doctor how much and what kind of exercise is best for you.  Go to the bathroom when you have the urge to poop. Do not wait.  Avoid pushing too hard when you poop.  Keep your   butt dry and clean. Use wet toilet paper or moist towelettes after pooping.  Do not sit on the toilet for a long time.  Keep all follow-up visits as told by your doctor. This is important. Contact a doctor if you:  Have pain and swelling that do not get better with treatment or medicine.  Have trouble pooping.  Cannot poop.  Have pain or swelling outside the area of the hemorrhoids. Get help right away if you have:  Bleeding that will not stop. Summary  Hemorrhoids are swollen veins in the butt or around the opening of the  butt.  They can cause pain, itching, or bleeding.  Eat foods that have a lot of fiber in them. These include whole grains, beans, nuts, fruits, and vegetables.  Take a warm-water bath (sitz bath) for 20 minutes to ease pain. Do this 3-4 times a day. This information is not intended to replace advice given to you by your health care provider. Make sure you discuss any questions you have with your health care provider. Document Revised: 02/01/2018 Document Reviewed: 06/15/2017 Elsevier Patient Education  2020 [employer].    Alcohol Abuse and Nutrition Alcohol abuse is any pattern of alcohol consumption that harms your health, relationships, or work. Alcohol abuse can cause poor nutrition (malnutrition or malnourishment) and a lack of nutrients (nutrient deficiencies), which can lead to more complications. Alcohol abuse brings malnutrition and nutrient deficiencies in two ways:  It causes your liver to work abnormally. This affects how your body divides (breaks down) and absorbs nutrients from food.  It causes you to eat poorly. Many people who abuse alcohol do not eat enough carbohydrates, protein, fat, vitamins, and minerals. Nutrients that are commonly lacking (deficient) in people who abuse alcohol include:  Vitamins. ? Vitamin A. This is needed for your vision, metabolism, and ability to fight off infections (immunity). ? B vitamins. These include folate, thiamine, and niacin. These are needed for new cell growth. ? Vitamin C. This plays an important role in wound healing, immunity, and helping your body to absorb iron. ? Vitamin D. This is necessary for your body to absorb and use calcium. It is produced by your liver, but you can also get it from food and from sun exposure.  Minerals. ? Calcium. This is needed for healthy bones as well as heart and blood vessel (cardiovascular) function. ? Iron. This is important for blood, muscle, and nervous system functioning. ? Magnesium.  This plays an important role in muscle and nerve function, and it helps to control blood sugar and blood pressure. ? Zinc. This is important for the normal functioning of your nervous system and digestive system (gastrointestinal tract). If you think that you have an alcohol dependency problem, or if it is hard to stop drinking because you feel sick or different when you do not use alcohol, talk with your health care provider or another health professional about where to get help. Nutrition is an essential factor in therapy for alcohol abuse. Your health care provider or diet and nutrition specialist (dietitian) will work with you to design a plan that can help to restore nutrients to your body and prevent the risk of complications. What is my plan? Your dietitian may develop a specific eating plan that is based on your condition and any other problems that you have. An eating plan will commonly include:  A balanced diet. ? Grains: 6-8 oz (170-227 g) a day. Examples of 1 oz of whole grains include 1  cup of whole-wheat cereal,  cup of brown rice, or 1 slice of whole-wheat bread. ? Vegetables: 2-3 cups a day. Examples of 1 cup of vegetables include 2 medium carrots, 1 large tomato, or 2 stalks of celery. ? Fruits: 1-2 cups a day. Examples of 1 cup of fruit include 1 large banana, 1 small apple, 8 large strawberries, or 1 large orange. ? Meat and other protein: 5-6 oz (142-170 g) a day.  A cut of meat or fish that is the size of a deck of cards is about 3-4 oz.  Foods that provide 1 oz of protein include 1 egg,  cup of nuts or seeds, or 1 tablespoon (16 g) of peanut butter. ? Dairy: 2-3 cups a day. Examples of 1 cup of dairy include 8 oz (230 mL) of milk, 8 oz (230 g) of yogurt, or 1 oz (44 g) of natural cheese.  Vitamin and mineral supplements. What are tips for following this plan?  Eat frequent meals and snacks. Try to eat 5-6 small meals each day.  Take vitamin or mineral supplements as  recommended by your dietitian.  If you are malnourished or if your dietitian recommends it: ? You may follow a high-protein, high-calorie diet. This may include:  2,000-3,000 calories (kilocalories) a day.  70-100 g (grams) of protein a day. ? You may be directed to follow a diet that includes a complete nutritional supplement beverage. This can help to restore calories, protein, and vitamins to your body. Depending on your condition, you may be advised to consume this beverage instead of your meals or in addition to them.  Certain medicines may cause changes in your appetite, taste, and weight. Work with your health care provider and dietitian to make any changes to your medicines and eating plan.  If you are unable to take in enough food and calories by mouth, your health care provider may recommend a feeding tube. This tube delivers nutritional supplements directly to your stomach. Recommended foods  Eat foods that are high in molecules that prevent oxygen from reacting with your food (antioxidants). These foods include grapes, berries, nuts, green tea, and dark green or orange vegetables. Eating these can help to prevent some of the stress that is placed on your liver by consuming alcohol.  Eat a variety of fresh fruits and vegetables each day. This will help you to get fiber and vitamins in your diet.  Drink plenty of water and other clear fluids, such as apple juice and broth. Try to drink at least 48-64 oz (1.5-2 L) of water a day.  Include foods fortified with vitamins and minerals in your diet. Commonly fortified foods include milk, orange juice, cereal, and bread.  Eat a variety of foods that are high in omega-3 and omega-6 fatty acids. These include fish, nuts and seeds, and soybeans. These foods may help your liver to recover and may also stabilize your mood.  If you are a vegetarian: ? Eat a variety of protein-rich foods. ? Pair whole grains with plant-based proteins at meals  and snack time. For example, eat rice with beans, put peanut butter on whole-grain toast, or eat oatmeal with sunflower seeds. The items listed above may not be a complete list of foods and beverages you can eat. Contact a dietitian for more information. Foods to avoid  Avoid foods and drinks that are high in fat and sugar. Sugary drinks, salty snacks, and candy contain empty calories. This means that they lack important nutrients such as  protein, fiber, and vitamins.  Avoid alcohol. This is the best way to avoid malnutrition due to alcohol abuse. If you must drink, drink measured amounts. Measured drinking means limiting your intake to no more than 1 drink a day for nonpregnant women and 2 drinks a day for men. One drink equals 12 oz (355 mL) of beer, 5 oz (148 mL) of wine, or 1 oz (44 mL) of hard liquor.  Limit your intake of caffeine. Replace drinks like coffee and black tea with decaffeinated coffee and decaffeinated herbal tea. The items listed above may not be a complete list of foods and beverages you should avoid. Contact a dietitian for more information. Summary  Alcohol abuse can cause poor nutrition (malnutrition or malnourishment) and a lack of nutrients (nutrient deficiencies), which can lead to more health problems.  Common nutrient deficiencies include vitamin deficiencies (A, B, C, and D) and mineral deficiencies (calcium, iron, magnesium, and zinc).  Nutrition is an essential factor in therapy for alcohol abuse.  Your health care provider and dietitian can help you to develop a specific eating plan that includes a balanced diet plus vitamin and mineral supplements. This information is not intended to replace advice given to you by your health care provider. Make sure you discuss any questions you have with your health care provider. Document Revised: 05/15/2018 Document Reviewed: 10/11/2016 Elsevier Patient Education  [place].

## 2019-05-28 NOTE — ED Notes (Signed)
Pt asking for food and drink. Per Dr. Gonzella Lex, MD pt is NPO till GI sees him. States he can have ice chips but pt refused at this time.

## 2021-05-18 ENCOUNTER — Encounter: Payer: Self-pay | Admitting: Intensive Care

## 2021-05-18 ENCOUNTER — Emergency Department
Admission: EM | Admit: 2021-05-18 | Discharge: 2021-05-18 | Disposition: A | Discharge status: Home / Self Care | Payer: Medicaid - Out of State | Attending: Emergency Medicine | Admitting: Emergency Medicine

## 2021-05-18 ENCOUNTER — Other Ambulatory Visit: Payer: Self-pay

## 2021-05-18 DIAGNOSIS — M1990 Unspecified osteoarthritis, unspecified site: Secondary | ICD-10-CM | POA: Insufficient documentation

## 2021-05-18 DIAGNOSIS — R0981 Nasal congestion: Secondary | ICD-10-CM | POA: Diagnosis not present

## 2021-05-18 DIAGNOSIS — M791 Myalgia, unspecified site: Secondary | ICD-10-CM | POA: Insufficient documentation

## 2021-05-18 HISTORY — DX: Unspecified osteoarthritis, unspecified site: M19.90

## 2021-05-18 MED ORDER — GUAIFENESIN ER 600 MG PO TB12: 600.0000 mg | ORAL_TABLET | Freq: Two times a day (BID) | ORAL | 11 refills | Status: DC

## 2021-05-18 MED ORDER — IBUPROFEN 800 MG PO TABS: 800.0000 mg | ORAL_TABLET | Freq: Three times a day (TID) | ORAL | 0 refills | Status: DC | PRN

## 2021-05-18 NOTE — Discharge Instructions (Addendum)
-  Please establish with a primary care provider, as discussed. ?-You may take Tylenol as needed for your arthritis.  Utilize ibuprofen sparingly given your history of GI bleeding. ?-You may take Mucinex (guaifenesin) as needed for your sinus congestion. ?-Return to the emergency department anytime if you begin to experience any new or worsening symptoms. ?

## 2021-05-18 NOTE — ED Provider Notes (Signed)
? ?[facility] ?Provider Note ? ? ? Event Date/Time  ? First MD Initiated Contact with Patient 05/18/21 1807   ?  (approximate) ? ? ?History  ? ?Chief Complaint ?Generalized Body Aches and Nasal Congestion ? ? ?HPI ?Roger Franco is a 66 y.o. male, history of alcoholic cirrhosis, GI bleed, arthritis, presents to the emergency department for evaluation of sinus congestion and body aches.  Patient states that he has a history of arthritis has been having body aches chronically for several years.  He is requesting medication for this, as he moved here recently from [place] and does not have his medications with him.  Additionally, he endorses recent sinus congestion for the past few weeks.  Denies fever/chills, chest pain, shortness of breath, abdominal pain, flank pain, nausea/vomiting, diarrhea, urinary symptoms, numbness/tingling in upper or lower extremities, or dizziness/lightheadedness. ? ?History Limitations: No limitations. ? ?    ? ? ?Physical Exam  ?Triage Vital Signs: ?ED Triage Vitals [05/18/21 1718]  ?Enc Vitals Group  ?   BP 111/62  ?   Pulse Rate 76  ?   Resp 16  ?   Temp 98.4 ?F (36.9 ?C)  ?   Temp Source Oral  ?   SpO2 98 %  ?   Weight 141 lb (64 kg)  ?   Height 5\' 8"  (1.727 m)  ?   Head Circumference   ?   Peak Flow   ?   Pain Score 8  ?   Pain Loc   ?   Pain Edu?   ?   Excl. in GC?   ? ? ?Most recent vital signs: ?Vitals:  ? 05/18/21 1718 05/18/21 1928  ?BP: 111/62 115/82  ?Pulse: 76 73  ?Resp: 16 16  ?Temp: 98.4 ?F (36.9 ?C)   ?SpO2: 98% 96%  ? ? ?General: Awake, NAD.  Audible sinus congestion. ?Skin: Warm, dry. No rashes or lesions.  ?Eyes: PERRL. Conjunctivae normal.  ?Neck: Normal ROM. No nuchal rigidity.  ?CV: Good peripheral perfusion.  ?Resp: Normal effort.  ?Abd: Soft, non-tender. No distention.  ?Neuro: At baseline. No gross neurological deficits.  ?MSK: No gross deformities. Normal ROM in all extremities.  ? ? ?Physical Exam ? ? ? ?ED Results / Procedures / Treatments   ?Labs ?(all labs ordered are listed, but only abnormal results are displayed) ?Labs Reviewed - No data to display ? ? ?EKG ?Not applicable. ? ? ?RADIOLOGY ? ?ED Provider Interpretation: Not applicable. ? ?No results found. ? ?PROCEDURES: ? ?Critical Care performed: Not applicable. ? ?Procedures ? ? ? ?MEDICATIONS ORDERED IN ED: ?Medications - No data to display ? ? ?IMPRESSION / MDM / ASSESSMENT AND PLAN / ED COURSE  ?I reviewed the triage vital signs and the nursing notes. ?             ?               ? ?Differential diagnosis includes, but is not limited to, allergic rhinitis, viral URI, arthritis. ? ?ED Course ?Patient appears well.  Vitals within normal limits.  NAD.  Currently afebrile. ? ?Assessment/Plan ?Presentation consistent with viral URI versus allergic rhinitis.  Will provide patient with a prescription for Mucinex, as requested.  Patient is additionally requesting ibuprofen for his arthritis.  We will provide, however did caution him on his risk of GI bleeding with chronic use.  Recommended that he take acetaminophen as well.  Patient is satisfied with this plan.  We will plan to  discharge. ? ?Provided the patient with anticipatory guidance, return precautions, and educational material. Encouraged the patient to return to the emergency department at any time if they begin to experience any new or worsening symptoms. Patient expressed understanding and agreed with the plan.  ? ?  ? ? ?FINAL CLINICAL IMPRESSION(S) / ED DIAGNOSES  ? ?Final diagnoses:  ?Nasal congestion  ?Arthritis  ? ? ? ?Rx / DC Orders  ? ?ED Discharge Orders   ? ?      Ordered  ?  ibuprofen (ADVIL) 800 MG tablet  Every 8 hours PRN       ? 05/18/21 1920  ?  guaiFENesin (MUCINEX) 600 MG 12 hr tablet  2 times daily       ? 05/18/21 1920  ? ?  ?  ? ?  ? ? ? ?Note:  This document was prepared using Dragon voice recognition software and may include unintentional dictation errors. ?  ?Varney Daily, [place] ?05/19/21 1002 ? ?  ?Jene Every, MD ?05/24/21 0701 ? ?

## 2021-05-18 NOTE — ED Triage Notes (Signed)
Patient c/o generalized body aches and mucous buildup. Patient moved here recently from [place] and was getting chronic pain medication prescribed there and is now out. HX arthritis ?

## 2021-05-18 NOTE — ED Triage Notes (Signed)
Arrived by EMS from the street. HX arthritis. Generalized body aches X15 years. Was on chronic pain medication in [place]. Has been out of medication for three months since living here in [place]. ?

## 2021-06-17 ENCOUNTER — Emergency Department: Payer: Commercial Managed Care - HMO

## 2021-06-17 ENCOUNTER — Emergency Department
Admission: EM | Admit: 2021-06-17 | Discharge: 2021-06-17 | Disposition: A | Discharge status: Home / Self Care | Payer: Commercial Managed Care - HMO | Attending: Emergency Medicine | Admitting: Emergency Medicine

## 2021-06-17 ENCOUNTER — Other Ambulatory Visit: Payer: Self-pay

## 2021-06-17 DIAGNOSIS — E871 Hypo-osmolality and hyponatremia: Secondary | ICD-10-CM | POA: Diagnosis not present

## 2021-06-17 DIAGNOSIS — R040 Epistaxis: Secondary | ICD-10-CM | POA: Diagnosis not present

## 2021-06-17 DIAGNOSIS — R519 Headache, unspecified: Secondary | ICD-10-CM | POA: Insufficient documentation

## 2021-06-17 LAB — COMPREHENSIVE METABOLIC PANEL
ALT: 48 U/L — ABNORMAL HIGH (ref 0–44)
AST: 127 U/L — ABNORMAL HIGH (ref 15–41)
Albumin: 3.4 g/dL — ABNORMAL LOW (ref 3.5–5.0)
Alkaline Phosphatase: 106 U/L (ref 38–126)
Anion gap: 6 (ref 5–15)
BUN: 13 mg/dL (ref 8–23)
CO2: 21 mmol/L — ABNORMAL LOW (ref 22–32)
Calcium: 9.5 mg/dL (ref 8.9–10.3)
Chloride: 104 mmol/L (ref 98–111)
Creatinine, Ser: 0.95 mg/dL (ref 0.61–1.24)
GFR, Estimated: >60 mL/min (ref >60)
Glucose, Bld: 84 mg/dL (ref 70–99)
Potassium: 4.1 mmol/L (ref 3.5–5.1)
Sodium: 131 mmol/L — ABNORMAL LOW (ref 135–145)
Total Bilirubin: 3.1 mg/dL — ABNORMAL HIGH (ref 0.3–1.2)
Total Protein: 8.4 g/dL — ABNORMAL HIGH (ref 6.5–8.1)

## 2021-06-17 LAB — CBC WITH DIFFERENTIAL/PLATELET
Abs Immature Granulocytes: 0.01 10*3/uL (ref 0.00–0.07)
Basophils Absolute: 0 10*3/uL (ref 0.0–0.1)
Basophils Relative: 1 %
Eosinophils Absolute: 0 10*3/uL (ref 0.0–0.5)
Eosinophils Relative: 1 %
HCT: 41.7 % (ref 39.0–52.0)
Hemoglobin: 14.1 g/dL (ref 13.0–17.0)
Immature Granulocytes: 0 %
Lymphocytes Relative: 36 %
Lymphs Abs: 2 10*3/uL (ref 0.7–4.0)
MCH: 29.8 pg (ref 26.0–34.0)
MCHC: 33.8 g/dL (ref 30.0–36.0)
MCV: 88.2 fL (ref 80.0–100.0)
Monocytes Absolute: 1 10*3/uL (ref 0.1–1.0)
Monocytes Relative: 19 %
Neutro Abs: 2.4 10*3/uL (ref 1.7–7.7)
Neutrophils Relative %: 43 %
Platelets: 100 10*3/uL — ABNORMAL LOW (ref 150–400)
RBC: 4.73 MIL/uL (ref 4.22–5.81)
RDW: 17.6 % — ABNORMAL HIGH (ref 11.5–15.5)
WBC: 5.4 10*3/uL (ref 4.0–10.5)
nRBC: 0 % (ref 0.0–0.2)

## 2021-06-17 LAB — PROTIME-INR
INR: 1.2 (ref 0.8–1.2)
Prothrombin Time: 15.2 seconds (ref 11.4–15.2)

## 2021-06-17 MED ORDER — SODIUM CHLORIDE 0.9 % IV BOLUS
500.0000 mL | Freq: Once | INTRAVENOUS | Status: AC
  Administered 2021-06-17: 500 mL via INTRAVENOUS

## 2021-06-17 MED ORDER — GADOBUTROL 1 MMOL/ML IV SOLN
6.0000 mL | Freq: Once | INTRAVENOUS | Status: AC | PRN
  Administered 2021-06-17: 6 mL via INTRAVENOUS
  Filled 2021-06-17: qty 6

## 2021-06-17 MED ORDER — LORAZEPAM 2 MG/ML IJ SOLN
1.0000 mg | Freq: Once | INTRAMUSCULAR | Status: AC
  Administered 2021-06-17: 1 mg via INTRAVENOUS
  Filled 2021-06-17: qty 1

## 2021-06-17 NOTE — ED Triage Notes (Signed)
Pt comes into the ED via EMS from [facility], with c/o nose bleed while walking today, HA with bloody nose and sinus congestion for the past week.  ? ? ?134/79 ?98.6 temp ?HR86 ?97%RA ?

## 2021-06-17 NOTE — ED Notes (Addendum)
Pt verbalized understanding of discharge instructions and follow-up care instructions. Pt advised if symptoms worsen to return to ED.  

## 2021-06-17 NOTE — ED Provider Triage Note (Signed)
Emergency Medicine Provider Triage Evaluation Note ? ?Roger Franco , a 66 y.o. male  was evaluated in triage.  Pt complains of nosebleed, congestion, toe pain. ? ?Review of Systems  ?Positive: As above ?Negative: No cough ? ?Physical Exam  ?BP 117/80 (BP Location: Right Arm)   Pulse 80   Temp 98.6 ?F (37 ?C) (Oral)   Resp 16   SpO2 97%  ?Gen:   Awake, no distress   ?Resp:  Normal effort  ?MSK:   Moves extremities without difficulty  ?Other:   ? ?Medical Decision Making  ?Medically screening exam initiated at 1:40 PM.  Appropriate orders placed.  Roger Franco was informed that the remainder of the evaluation will be completed by another provider, this initial triage assessment does not replace that evaluation, and the importance of remaining in the ED until their evaluation is complete. ? ? ?  ?Roger Every, MD ?06/17/21 1341 ? ?

## 2021-06-17 NOTE — ED Provider Notes (Signed)
? ?[facility] ?Provider Note ? ?Patient Contact: 3:18 PM (approximate) ? ? ?History  ? ?Epistaxis and Sinusitis ? ? ?HPI ? ?Roger Franco is a 66 y.o. male with a history of cirrhosis, prior GI bleed and arthritis presents to the emergency department with concern for headache, sinus pressure and pain and intermittent epistaxis that is occurred over the past several weeks.  Patient was transported by EMS from urgent care with similar complaints.  He denies weakness of the arms and legs.  No chest pain, chest tightness or abdominal pain. ? ?  ? ? ?Physical Exam  ? ?Triage Vital Signs: ?ED Triage Vitals  ?Enc Vitals Group  ?   BP 06/17/21 1339 117/80  ?   Pulse Rate 06/17/21 1339 80  ?   Resp 06/17/21 1339 16  ?   Temp 06/17/21 1339 98.6 ?F (37 ?C)  ?   Temp Source 06/17/21 1339 Oral  ?   SpO2 06/17/21 1339 97 %  ?   Weight 06/17/21 1448 140 lb 14 oz (63.9 kg)  ?   Height 06/17/21 1448 5\' 8"  (1.727 m)  ?   Head Circumference --   ?   Peak Flow --   ?   Pain Score --   ?   Pain Loc --   ?   Pain Edu? --   ?   Excl. in GC? --   ? ? ?Most recent vital signs: ?Vitals:  ? 06/17/21 1653 06/17/21 1856  ?BP: 139/75 138/83  ?Pulse: 68 72  ?Resp: 18 18  ?Temp:    ?SpO2: 99% 97%  ? ? ? ?General: Alert and in no acute distress. ?Eyes:  PERRL. EOMI. ?Head: No acute traumatic findings ?ENT: ?     Nose: No congestion/rhinnorhea. ?     Mouth/Throat: Mucous membranes are moist.  ?Neck: No stridor. No cervical spine tenderness to palpation. ?Cardiovascular:  Good peripheral perfusion ?Respiratory: Normal respiratory effort without tachypnea or retractions. Lungs CTAB. Good air entry to the bases with no decreased or absent breath sounds. ?Gastrointestinal: Bowel sounds ?4 quadrants. Soft and nontender to palpation. No guarding or rigidity. No palpable masses. No distention. No CVA tenderness. ?Musculoskeletal: Full range of motion to all extremities.  ?Neurologic:  No gross focal neurologic deficits are  appreciated.  ?Skin:   No rash noted ?Other: ? ? ?ED Results / Procedures / Treatments  ? ?Labs ?(all labs ordered are listed, but only abnormal results are displayed) ?Labs Reviewed  ?CBC WITH DIFFERENTIAL/PLATELET - Abnormal; Notable for the following components:  ?    Result Value  ? RDW 17.6 (*)   ? Platelets 100 (*)   ? All other components within normal limits  ?COMPREHENSIVE METABOLIC PANEL - Abnormal; Notable for the following components:  ? Sodium 131 (*)   ? CO2 21 (*)   ? Total Protein 8.4 (*)   ? Albumin 3.4 (*)   ? AST 127 (*)   ? ALT 48 (*)   ? Total Bilirubin 3.1 (*)   ? All other components within normal limits  ?PROTIME-INR  ? ? ? ? ? ?RADIOLOGY ? ?I personally viewed and evaluated these images as part of my medical decision making, as well as reviewing the written report by the radiologist. ? ?ED Provider Interpretation: I personally interpreted brain MRI and agree with radiologist interpretation.  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  No evidence of thrombosis. ? ? ?PROCEDURES: ? ?  Critical Care performed: No ? ?Procedures ? ? ?MEDICATIONS ORDERED IN ED: ?Medications  ?LORazepam (ATIVAN) injection 1 mg (1 mg Intravenous Given 06/17/21 1855)  ?sodium chloride 0.9 % bolus 500 mL (0 mLs Intravenous Stopped 06/17/21 2108)  ?gadobutrol (GADAVIST) 1 MMOL/ML injection 6 mL (6 mLs Intravenous Contrast Given 06/17/21 1953)  ? ? ? ?IMPRESSION / MDM / ASSESSMENT AND PLAN / ED COURSE  ?I reviewed the triage vital signs and the nursing notes. ?             ?               ? ?Assessment and plan:  ?Epistaxis:  ?Headache ?66 year old male presents to the emergency department with headache and intermittent epistaxis over the past 2 weeks. ? ?Vital signs are reassuring at triage.  On physical exam, patient was alert and nontoxic-appearing.  He had no deficits on neuro exam. ? ?Patient had mild hyponatremia on CMP and AST and ALT were largely  consistent with patient's baseline.  CBC and PT/INR unremarkable. ?  ?CT head concerning for prominent tubular structure within the left ambient cistern ? ?Radiologist recommended MRI brain with contrast for further characterization.  I reached out to neurologist Dr. Iver Nestle who agreed with MRI brain with contrast. ? 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  No evidence of thrombosis. ? ?We will have patient follow-up with primary care for further care and management. ?FINAL CLINICAL IMPRESSION(S) / ED DIAGNOSES  ? ?Final diagnoses:  ?Nonintractable headache, unspecified chronicity pattern, unspecified headache type  ? ? ? ?Rx / DC Orders  ? ?ED Discharge Orders   ? ? None  ? ?  ? ? ? ?Note:  This document was prepared using Dragon voice recognition software and may include unintentional dictation errors. ?  ?Orvil Feil, PA-C ?06/17/21 2113 ? ?  ?Chesley Noon, MD ?06/19/21 0845 ? ?

## 2021-08-19 ENCOUNTER — Ambulatory Visit: Payer: PRIVATE HEALTH INSURANCE | Admitting: Nurse Practitioner

## 2021-08-26 ENCOUNTER — Ambulatory Visit: Payer: PRIVATE HEALTH INSURANCE | Admitting: Nurse Practitioner

## 2021-10-26 ENCOUNTER — Encounter: Payer: Self-pay | Admitting: Emergency Medicine

## 2021-10-26 ENCOUNTER — Other Ambulatory Visit: Payer: Self-pay

## 2021-10-26 ENCOUNTER — Emergency Department
Admission: EM | Admit: 2021-10-26 | Discharge: 2021-10-26 | Disposition: A | Discharge status: Home / Self Care | Payer: Medicaid Other | Attending: Emergency Medicine | Admitting: Emergency Medicine

## 2021-10-26 ENCOUNTER — Emergency Department: Payer: Medicaid Other

## 2021-10-26 DIAGNOSIS — J069 Acute upper respiratory infection, unspecified: Secondary | ICD-10-CM | POA: Diagnosis not present

## 2021-10-26 DIAGNOSIS — G894 Chronic pain syndrome: Secondary | ICD-10-CM | POA: Insufficient documentation

## 2021-10-26 DIAGNOSIS — F17200 Nicotine dependence, unspecified, uncomplicated: Secondary | ICD-10-CM | POA: Insufficient documentation

## 2021-10-26 DIAGNOSIS — R0602 Shortness of breath: Secondary | ICD-10-CM | POA: Diagnosis not present

## 2021-10-26 DIAGNOSIS — M1990 Unspecified osteoarthritis, unspecified site: Secondary | ICD-10-CM | POA: Diagnosis not present

## 2021-10-26 DIAGNOSIS — R0981 Nasal congestion: Secondary | ICD-10-CM | POA: Diagnosis present

## 2021-10-26 DIAGNOSIS — R0789 Other chest pain: Secondary | ICD-10-CM | POA: Insufficient documentation

## 2021-10-26 LAB — BASIC METABOLIC PANEL
Anion gap: 7 (ref 5–15)
BUN: 13 mg/dL (ref 8–23)
CO2: 21 mmol/L — ABNORMAL LOW (ref 22–32)
Calcium: 8.8 mg/dL — ABNORMAL LOW (ref 8.9–10.3)
Chloride: 110 mmol/L (ref 98–111)
Creatinine, Ser: 0.92 mg/dL (ref 0.61–1.24)
GFR, Estimated: >60 mL/min (ref >60)
Glucose, Bld: 116 mg/dL — ABNORMAL HIGH (ref 70–99)
Potassium: 3.6 mmol/L (ref 3.5–5.1)
Sodium: 138 mmol/L (ref 135–145)

## 2021-10-26 LAB — CBC
HCT: 39.7 % (ref 39.0–52.0)
Hemoglobin: 13.3 g/dL (ref 13.0–17.0)
MCH: 30.2 pg (ref 26.0–34.0)
MCHC: 33.5 g/dL (ref 30.0–36.0)
MCV: 90 fL (ref 80.0–100.0)
Platelets: 104 10*3/uL — ABNORMAL LOW (ref 150–400)
RBC: 4.41 MIL/uL (ref 4.22–5.81)
RDW: 16.6 % — ABNORMAL HIGH (ref 11.5–15.5)
WBC: 4.7 10*3/uL (ref 4.0–10.5)
nRBC: 0 % (ref 0.0–0.2)

## 2021-10-26 LAB — TROPONIN I (HIGH SENSITIVITY): Troponin I (High Sensitivity): 5 ng/L (ref <18)

## 2021-10-26 MED ORDER — ALBUTEROL SULFATE HFA 108 (90 BASE) MCG/ACT IN AERS: 2.0000 | INHALATION_SPRAY | RESPIRATORY_TRACT | 0 refills | Status: AC | PRN

## 2021-10-26 MED ORDER — ALBUTEROL SULFATE HFA 108 (90 BASE) MCG/ACT IN AERS: 2.0000 | INHALATION_SPRAY | RESPIRATORY_TRACT | 0 refills | Status: DC | PRN

## 2021-10-26 MED ORDER — PREDNISONE 20 MG PO TABS: 40.0000 mg | ORAL_TABLET | Freq: Every day | ORAL | 0 refills | Status: DC

## 2021-10-26 MED ORDER — HYDROCODONE-ACETAMINOPHEN 5-325 MG PO TABS: 1.0000 | ORAL_TABLET | Freq: Four times a day (QID) | ORAL | 0 refills | Status: AC | PRN

## 2021-10-26 MED ORDER — HYDROCODONE-ACETAMINOPHEN 5-325 MG PO TABS: 1.0000 | ORAL_TABLET | Freq: Four times a day (QID) | ORAL | 0 refills | Status: DC | PRN

## 2021-10-26 MED ORDER — AMOXICILLIN-POT CLAVULANATE 875-125 MG PO TABS: 1.0000 | ORAL_TABLET | Freq: Two times a day (BID) | ORAL | 0 refills | Status: DC

## 2021-10-26 MED ORDER — PREDNISONE 20 MG PO TABS: 40.0000 mg | ORAL_TABLET | Freq: Every day | ORAL | 0 refills | Status: AC

## 2021-10-26 MED ORDER — AMOXICILLIN-POT CLAVULANATE 875-125 MG PO TABS: 1.0000 | ORAL_TABLET | Freq: Two times a day (BID) | ORAL | 0 refills | Status: AC

## 2021-10-26 NOTE — ED Triage Notes (Signed)
X 2 days, non radiates, nasal congestion

## 2021-10-26 NOTE — ED Provider Notes (Signed)
[facility] Provider Note    Event Date/Time   First MD Initiated Contact with Patient 10/26/21 1321     (approximate)   History   Chest Pain   HPI  Roger Franco is a 66 y.o. male here with multiple complaints.  The patient's primary complaint is generalized body aches, fatigue, and and some chest pressure with shortness of breath.  The patient states this has been ongoing for several days.  He states that he has also had some sinus congestion, facial pressure, as well as sensation of moderate frontal headache.  Patient has also had chest pressure which she describes as a sensation of tightness and fullness.  He does smoke.  Has been coughing.  He said occasional sputum production.  Denies known fevers.  No meningismus.  Otherwise, the patient has multiple fairly chronic complaints.  He has fairly severe chronic arthritis.  He states that he was diagnosed with rheumatoid arthritis in the past.  He states that his symptoms have been progressively worsening and that he does not currently have a primary physician.  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  Denies focal field deficits.  This is also been a chronic issue for which she has not seen an optometrist.     Physical Exam   Triage Vital Signs: ED Triage Vitals  Enc Vitals Group     BP 10/26/21 1137 137/84     Pulse Rate 10/26/21 1137 70     Resp 10/26/21 1137 18     Temp 10/26/21 1137 98.1 F (36.7 C)     Temp Source 10/26/21 1137 Oral     SpO2 10/26/21 1137 96 %     Weight 10/26/21 1138 154 lb 5.2 oz (70 kg)     Height 10/26/21 1138 5\' 9"  (1.753 m)     Head Circumference --      Peak Flow --      Pain Score 10/26/21 1137 6     Pain Loc --      Pain Edu? --      Excl. in [place]? --     Most recent vital signs: Vitals:   10/26/21 1452 10/26/21 1555  BP: 130/80 127/78  Pulse: 70 69  Resp: 18 19  Temp: 98 F (36.7  C) 98 F (36.7 C)  SpO2: 96% 97%     General: Awake, no distress.  CV:  Good peripheral perfusion.  Regular rate and rhythm.  No murmurs rubs. Resp:  Normal effort.  Lungs clear to auscultation bilaterally.  Very scant end expiratory wheezes noted.  Normal work of breathing. Abd:  No distention.  No tenderness.  No guarding or rebound.  No right upper quadrant tenderness. Other:  No lower extremity swelling.  Moderate nasal congestion and maxillary sinus tenderness bilaterally.  Mild posterior pharyngeal erythema.  No tonsillar swelling or exudates.  On eye exam, pupils are equal, round, reactive bilaterally.  Extraocular movements are intact.  Globes soft.  Cataract noted likely in the right eye.  No significant conjunctival injection.  No photophobia.   ED Results / Procedures / Treatments   Labs (all labs ordered are listed, but only abnormal results are displayed) Labs Reviewed  BASIC METABOLIC PANEL - Abnormal; Notable for the following components:      Result Value   CO2 21 (*)    Glucose, Bld 116 (*)    Calcium 8.8 (*)  All other components within normal limits  CBC - Abnormal; Notable for the following components:   RDW 16.6 (*)    Platelets 104 (*)    All other components within normal limits  TROPONIN I (HIGH SENSITIVITY)    EKG: Sinus bradycardia, VR 56. PR 170, QRS 78, QTc 428. No acute ischemia or infarct.  RADIOLOGY Chest x-ray: No active cardiopulmonary disease   I also independently reviewed and agree with radiologist interpretations.   PROCEDURES:  Critical Care performed: No  MEDICATIONS ORDERED IN ED: Medications - No data to display   IMPRESSION / MDM / ASSESSMENT AND PLAN / ED COURSE  I reviewed the triage vital signs and the nursing notes.                               Ddx:  Differential includes the following, with pertinent life- or limb-threatening emergencies considered:  Cough/CP/chills: Likely URI, possibly bronchitis/COPD  exacerbation, PNA, PE, PTX, ACS, CHF, MSK chest wall pain  Patient's presentation is most consistent with acute presentation with potential threat to life or bodily function.  MDM:  66 yo M with h/o arthritis here with multiple complaints, primarily CP, SOB, cough, nasal congestion, and fatigue. Suspect URI vs bronchitis, with possible component of inflammatory arthritis.CXR shows no PNA, PTX or other abnormality. EKG is nonischemic and troponin negative - no signs of ACS. CBC shows no leukocytosis or anemia. BMP unremarkable. Majority of other complaints are chronic in nature. No apparent emergent medical condition.  Will tx for likely URI with possible component of COPD given smoking hx with sputum production and wheeze, Will give augmentin for his sputum production, cough, and sinusitis. Will give brief course of analgesics for his arthritis exacerbation and refer to PCP. Optho referral for his vision changes which clinically appear to be due to cataract.    MEDICATIONS GIVEN IN ED: Medications - No data to display   Consults:  None   EMR reviewed       FINAL CLINICAL IMPRESSION(S) / ED DIAGNOSES   Final diagnoses:  Upper respiratory tract infection, unspecified type  Arthritis  Chest pressure  Chronic pain syndrome     Rx / DC Orders   ED Discharge Orders          Ordered    predniSONE (DELTASONE) 20 MG tablet  Daily,   Status:  Discontinued        10/26/21 1454    albuterol (VENTOLIN HFA) 108 (90 Base) MCG/ACT inhaler  Every 4 hours PRN,   Status:  Discontinued        10/26/21 1454    HYDROcodone-acetaminophen (NORCO/VICODIN) 5-325 MG tablet  Every 6 hours PRN,   Status:  Discontinued        10/26/21 1454    amoxicillin-clavulanate (AUGMENTIN) 875-125 MG tablet  2 times daily,   Status:  Discontinued        10/26/21 1454    predniSONE (DELTASONE) 20 MG tablet  Daily        10/26/21 1456    albuterol (VENTOLIN HFA) 108 (90 Base) MCG/ACT inhaler  Every 4 hours PRN         10/26/21 1456    amoxicillin-clavulanate (AUGMENTIN) 875-125 MG tablet  2 times daily        10/26/21 1456    HYDROcodone-acetaminophen (NORCO/VICODIN) 5-325 MG tablet  Every 6 hours PRN        10/26/21 1456  Note:  This document was prepared using Dragon voice recognition software and may include unintentional dictation errors.   Duffy Bruce, MD 10/26/21 1945

## 2021-10-26 NOTE — Discharge Instructions (Signed)
For you cough, chest pressure, congestion: Take the antibiotic as prescribed Take the steroids  Use the inhaler every 4-6 hours for wheezing or shortness of breath  For your pani: Take over-the-counter medications Take the Norco for severe pain only Set up a PCP appointment  For your eyes: Call the [facility] to arrange follow-up

## 2021-11-12 ENCOUNTER — Emergency Department
Admission: EM | Admit: 2021-11-12 | Discharge: 2021-11-12 | Disposition: A | Discharge status: Home / Self Care | Payer: Medicaid Other | Attending: Emergency Medicine | Admitting: Emergency Medicine

## 2021-11-12 ENCOUNTER — Other Ambulatory Visit: Payer: Self-pay

## 2021-11-12 ENCOUNTER — Encounter: Payer: Self-pay | Admitting: Emergency Medicine

## 2021-11-12 ENCOUNTER — Emergency Department: Payer: Medicaid Other

## 2021-11-12 DIAGNOSIS — G4489 Other headache syndrome: Secondary | ICD-10-CM | POA: Diagnosis not present

## 2021-11-12 DIAGNOSIS — R519 Headache, unspecified: Secondary | ICD-10-CM | POA: Diagnosis not present

## 2021-11-12 DIAGNOSIS — Z789 Other specified health status: Secondary | ICD-10-CM | POA: Diagnosis not present

## 2021-11-12 LAB — CBC WITH DIFFERENTIAL/PLATELET
Abs Immature Granulocytes: 0.01 10*3/uL (ref 0.00–0.07)
Basophils Absolute: 0.1 10*3/uL (ref 0.0–0.1)
Basophils Relative: 2 %
Eosinophils Absolute: 0.1 10*3/uL (ref 0.0–0.5)
Eosinophils Relative: 3 %
HCT: 40.8 % (ref 39.0–52.0)
Hemoglobin: 13.5 g/dL (ref 13.0–17.0)
Immature Granulocytes: 0 %
Lymphocytes Relative: 39 %
Lymphs Abs: 1.7 10*3/uL (ref 0.7–4.0)
MCH: 30.1 pg (ref 26.0–34.0)
MCHC: 33.1 g/dL (ref 30.0–36.0)
MCV: 91.1 fL (ref 80.0–100.0)
Monocytes Absolute: 1 10*3/uL (ref 0.1–1.0)
Monocytes Relative: 22 %
Neutro Abs: 1.5 10*3/uL — ABNORMAL LOW (ref 1.7–7.7)
Neutrophils Relative %: 34 %
Platelets: 127 10*3/uL — ABNORMAL LOW (ref 150–400)
RBC: 4.48 MIL/uL (ref 4.22–5.81)
RDW: 16.6 % — ABNORMAL HIGH (ref 11.5–15.5)
WBC: 4.4 10*3/uL (ref 4.0–10.5)
nRBC: 0 % (ref 0.0–0.2)

## 2021-11-12 LAB — COMPREHENSIVE METABOLIC PANEL
ALT: 46 U/L — ABNORMAL HIGH (ref 0–44)
AST: 136 U/L — ABNORMAL HIGH (ref 15–41)
Albumin: 3.3 g/dL — ABNORMAL LOW (ref 3.5–5.0)
Alkaline Phosphatase: 73 U/L (ref 38–126)
Anion gap: 6 (ref 5–15)
BUN: 10 mg/dL (ref 8–23)
CO2: 26 mmol/L (ref 22–32)
Calcium: 9.1 mg/dL (ref 8.9–10.3)
Chloride: 111 mmol/L (ref 98–111)
Creatinine, Ser: 0.89 mg/dL (ref 0.61–1.24)
GFR, Estimated: >60 mL/min (ref >60)
Glucose, Bld: 96 mg/dL (ref 70–99)
Potassium: 4 mmol/L (ref 3.5–5.1)
Sodium: 143 mmol/L (ref 135–145)
Total Bilirubin: 1.2 mg/dL (ref 0.3–1.2)
Total Protein: 7.4 g/dL (ref 6.5–8.1)

## 2021-11-12 MED ORDER — NAPROXEN 500 MG PO TABS
500.0000 mg | ORAL_TABLET | Freq: Once | ORAL | Status: AC
  Administered 2021-11-12: 500 mg via ORAL
  Filled 2021-11-12: qty 1

## 2021-11-12 MED ORDER — BUTALBITAL-APAP-CAFFEINE 50-325-40 MG PO TABS
2.0000 | ORAL_TABLET | Freq: Once | ORAL | Status: AC
  Administered 2021-11-12: 2 via ORAL
  Filled 2021-11-12: qty 2

## 2021-11-12 MED ORDER — PROCHLORPERAZINE MALEATE 10 MG PO TABS
10.0000 mg | ORAL_TABLET | Freq: Once | ORAL | Status: AC
  Administered 2021-11-12: 10 mg via ORAL
  Filled 2021-11-12: qty 1

## 2021-11-12 NOTE — ED Provider Notes (Signed)
[facility] Provider Note    Event Date/Time   First MD Initiated Contact with Patient 11/12/21 [phone]     (approximate)   History   Headache   HPI  Roger Franco is a 66 y.o. male who presents to the ED for evaluation of Headache   Patient presents to the ED for evaluation of a bifrontal headache for the past 24 hours or so since he woke up Thursday morning.  He reports drinking night before, no injuries, events, falls, syncope or trauma but when he awoke the next morning he has been having headache all day and not improved with home Aleve.   Physical Exam   Triage Vital Signs: ED Triage Vitals  Enc Vitals Group     BP 11/12/21 0334 120/77     Pulse Rate 11/12/21 0334 64     Resp 11/12/21 0334 16     Temp 11/12/21 0334 98 F (36.7 C)     Temp Source 11/12/21 0334 Oral     SpO2 11/12/21 0334 93 %     Weight 11/12/21 0334 150 lb (68 kg)     Height 11/12/21 0334 5\' 8"  (1.727 m)     Head Circumference --      Peak Flow --      Pain Score 11/12/21 0335 7     Pain Loc --      Pain Edu? --      Excl. in [place]? --     Most recent vital signs: Vitals:   11/12/21 0540 11/12/21 0645  BP: 122/72 130/88  Pulse: 68 77  Resp: 18 18  Temp:  98.2 F (36.8 C)  SpO2: 95% 98%    General: Awake, no distress.  CV:  Good peripheral perfusion.  Resp:  Normal effort.  Abd:  No distention.  MSK:  No deformity noted.  Neuro:  No focal deficits appreciated. Cranial nerves II through XII intact 5/5 strength and sensation in all 4 extremities Other:     ED Results / Procedures / Treatments   Labs (all labs ordered are listed, but only abnormal results are displayed) Labs Reviewed  CBC WITH DIFFERENTIAL/PLATELET - Abnormal; Notable for the following components:      Result Value   RDW 16.6 (*)    Platelets 127 (*)    Neutro Abs 1.5 (*)    All other components within normal limits  COMPREHENSIVE METABOLIC PANEL - Abnormal; Notable for the following  components:   Albumin 3.3 (*)    AST 136 (*)    ALT 46 (*)    All other components within normal limits    EKG   RADIOLOGY CT head interpreted by me without evidence of acute intracranial pathology  Official radiology report(s): CT HEAD WO CONTRAST (5MM)  Result Date: 11/12/2021 CLINICAL DATA:  Frontal headache since yesterday EXAM: CT HEAD WITHOUT CONTRAST TECHNIQUE: Contiguous axial images were obtained from the base of the skull through the vertex without intravenous contrast. RADIATION DOSE REDUCTION: This exam was performed according to the departmental dose-optimization program which includes automated exposure control, adjustment of the mA and/or kV according to patient size and/or use of iterative reconstruction technique. COMPARISON:  06/17/2021 CT and MRI FINDINGS: Brain: No evidence of acute infarction, hemorrhage, hydrocephalus, extra-axial collection or mass lesion/mass effect. Tortuous vessel in the left ambient cistern as discussed on brain MRI comparison. Mild cerebral volume loss. Vascular: No hyperdense vessel or unexpected calcification. Skull: Normal. Negative for fracture or focal lesion. Sinuses/Orbits:  No acute finding. Secretions in the right sphenoid sinus also seen on prior. No acute sinusitis findings. IMPRESSION: No acute finding or explanation for headache. Electronically Signed   By: Jorje Guild M.D.   On: 11/12/2021 05:39    PROCEDURES and INTERVENTIONS:  Procedures  Medications  butalbital-acetaminophen-caffeine (FIORICET) 50-325-40 MG per tablet 2 tablet (2 tablets Oral Given 11/12/21 0545)  naproxen (NAPROSYN) tablet 500 mg (500 mg Oral Given 11/12/21 0545)  prochlorperazine (COMPAZINE) tablet 10 mg (10 mg Oral Given 11/12/21 0546)     IMPRESSION / MDM / ASSESSMENT AND PLAN / ED COURSE  I reviewed the triage vital signs and the nursing notes.  Differential diagnosis includes, but is not limited to, ICH, stroke, hangover, tension headache, sinus  infection  {Patient presents with symptoms of an acute illness or injury that is potentially life-threatening.  66 year old regular drinker presents to the ED with a bad headache after a night of drinking, without evidence of significant CNS pathology and ultimately suitable for outpatient management. Screening blood work obtained and his LFT derangements are noted but he has no abdominal symptoms and I doubt hepatobiliary obstruction.  Essentially normal CBC.  CT scan of the head is also reassuring.  Symptoms resolved with oral Fioricet, naproxen and Compazine.  I believe patient is suitable for outpatient management.  We discussed ethanol cessation and return precautions.  Clinical Course as of 11/12/21 0655  Fri Nov 12, 2021  1610 Reassessed. Feeling better [DS]    Clinical Course User Index [DS] Vladimir Crofts, MD     FINAL CLINICAL IMPRESSION(S) / ED DIAGNOSES   Final diagnoses:  Bad headache     Rx / DC Orders   ED Discharge Orders     None        Note:  This document was prepared using Dragon voice recognition software and may include unintentional dictation errors.   Vladimir Crofts, MD 11/12/21 [phone]

## 2021-11-12 NOTE — ED Notes (Signed)
Pt back from CT

## 2021-11-12 NOTE — ED Triage Notes (Signed)
EMS brings pt to triage from home via w/c with no distress noted; pt reports frontal HA since yesterday with no accomp symptoms; taking aleve without relief

## 2021-11-12 NOTE — ED Notes (Signed)
Pt to CT

## 2021-11-12 NOTE — ED Notes (Signed)
E-signature pad unavailable - Pt verbalized understanding of D/C information - no additional concerns at this time.  

## 2021-11-12 NOTE — Discharge Instructions (Signed)
Use naproxen/Aleve for anti-inflammatory pain relief. Use up to 500mg  every 12 hours. Do not take more frequently than this. Do not use other NSAIDs (ibuprofen, Advil) while taking this medication. It is safe to take Tylenol with this.

## 2021-11-24 ENCOUNTER — Other Ambulatory Visit: Payer: Self-pay | Admitting: Podiatry

## 2021-11-26 ENCOUNTER — Inpatient Hospital Stay
Admission: RE | Admit: 2021-11-26 | Discharge: 2021-11-26 | Disposition: A | Discharge status: Home / Self Care | Payer: PRIVATE HEALTH INSURANCE | Source: Ambulatory Visit

## 2021-11-26 HISTORY — DX: Gastrointestinal hemorrhage, unspecified: K92.2

## 2021-11-26 HISTORY — DX: Unspecified hemorrhoids: K64.9

## 2021-11-26 HISTORY — DX: Alcoholic cirrhosis of liver without ascites: K70.30

## 2021-11-26 HISTORY — DX: Tobacco use: Z72.0

## 2021-11-26 HISTORY — DX: Thrombocytopenia, unspecified: D69.6

## 2021-11-30 ENCOUNTER — Inpatient Hospital Stay
Admission: RE | Admit: 2021-11-30 | Discharge: 2021-11-30 | Disposition: A | Discharge status: Home / Self Care | Payer: Self-pay | Source: Ambulatory Visit

## 2021-11-30 NOTE — Pre-Procedure Instructions (Addendum)
Called Roger Franco to do his anesthesia interview. Roger Franco states he is going to have to reschedule his surgery due to not being able to get appt to see his pcp until after his surgery(appt is on 11-6-) with pcp, which is a requirement with dr cline. I gave Roger Franco Dr Rosann Auerbach office # and he will call office to let them know that his surgery will have to be rescheduled until after his 11-6 appt with his pcp. Roger Franco verbalized he will call office to let them know

## 2021-12-03 ENCOUNTER — Ambulatory Visit: Admission: RE | Admit: 2021-12-03 | Payer: Medicaid Other | Source: Home / Self Care | Admitting: Podiatry

## 2021-12-03 ENCOUNTER — Encounter: Admission: RE | Payer: Self-pay | Source: Home / Self Care

## 2021-12-03 SURGERY — BUNIONECTOMY
Anesthesia: Choice | Site: Toe | Laterality: Right

## 2022-02-01 DIAGNOSIS — R1110 Vomiting, unspecified: Secondary | ICD-10-CM | POA: Diagnosis not present

## 2022-02-01 DIAGNOSIS — J019 Acute sinusitis, unspecified: Secondary | ICD-10-CM | POA: Diagnosis not present

## 2022-02-01 DIAGNOSIS — R051 Acute cough: Secondary | ICD-10-CM | POA: Diagnosis not present

## 2022-03-28 DIAGNOSIS — F17200 Nicotine dependence, unspecified, uncomplicated: Secondary | ICD-10-CM | POA: Diagnosis not present

## 2022-03-28 DIAGNOSIS — J029 Acute pharyngitis, unspecified: Secondary | ICD-10-CM | POA: Diagnosis not present

## 2023-03-22 IMAGING — MR MR HEAD WO/W CM
14 series · 46 of 48 positions shown · IV contrast (gadavist)
Comparison: Prior CT from earlier the same day.

CLINICAL DATA: Initial evaluation for acute head trauma. Evaluate
vascular structure seen on prior head CT.

EXAM:
MRI HEAD WITHOUT AND WITH CONTRAST
TECHNIQUE: Multiplanar, multiecho pulse sequences of the brain and surrounding
structures were obtained without and with intravenous contrast.
CONTRAST:  6mL GADAVIST GADOBUTROL 1 MMOL/ML IV SOLN

[Series 5: ax dwi_tracew · axial · 3.0mm · 0.65mm/px · z∈[-77,+78]mm · 4 of 48 slices shown]
[im 1/48]
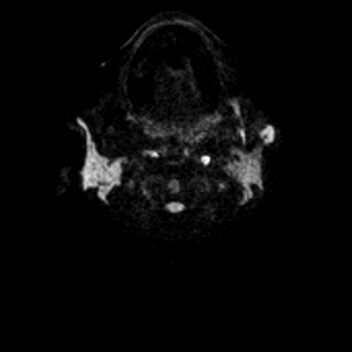
[im 16/48]
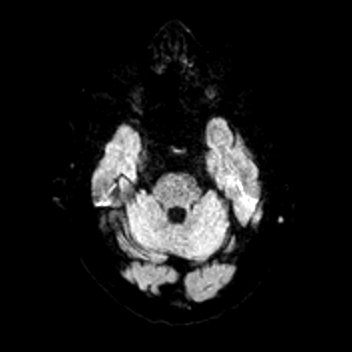
[im 32/48]
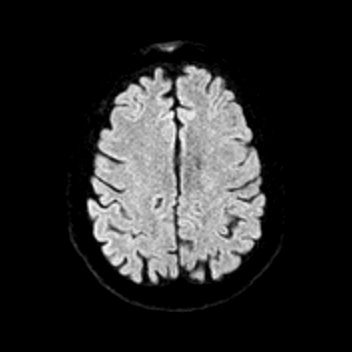
[im 48/48]
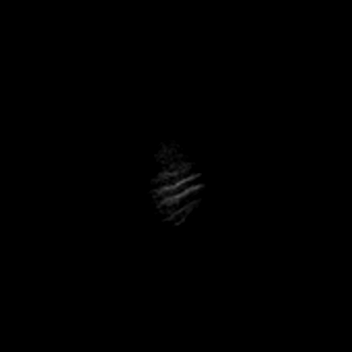

[Series 6: ax dwi_adc · axial · 3.0mm · 0.65mm/px · z∈[-77,+78]mm · 4 of 48 slices shown]
[im 1/48]
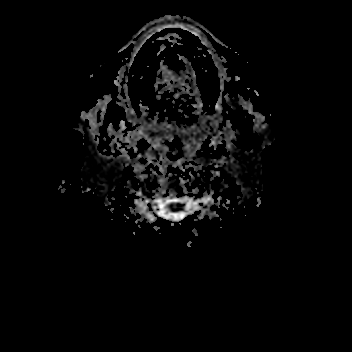
[im 16/48]
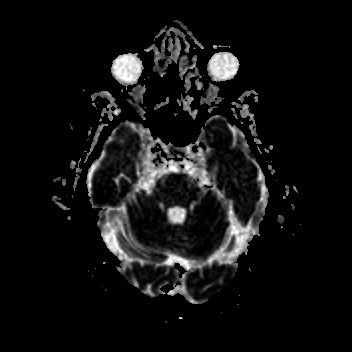
[im 32/48]
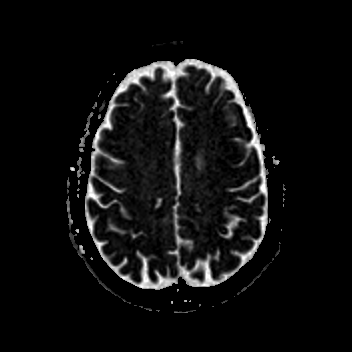
[im 48/48]
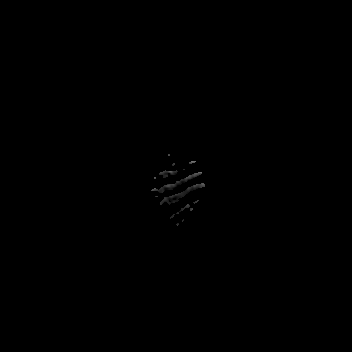

[Series 7: cor dwi_tracew · coronal · 5.0mm · 0.65mm/px · 2 of 40 slices shown]
[im 1/40]
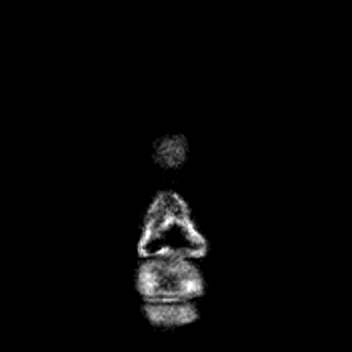
[im 40/40]
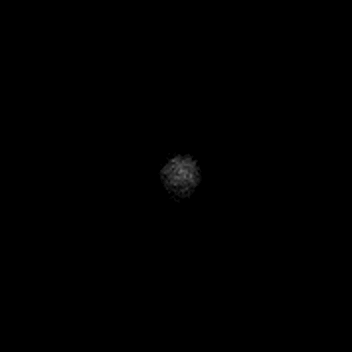

[Series 8: cor dwi_adc · coronal · 5.0mm · 0.65mm/px · 2 of 39 slices shown]
[im 1/39]
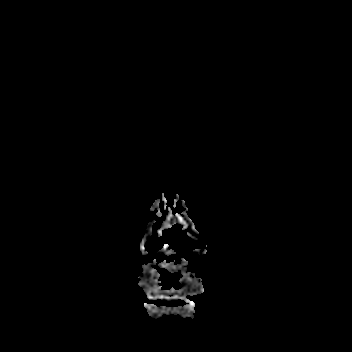
[im 39/39]
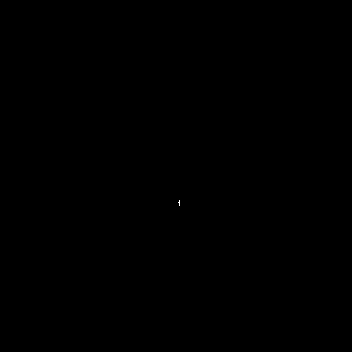

[Series 9: T1 · sagittal · 5.0mm · 0.62mm/px · 1 of 25 slices shown (1 of 2)]
[im 1/25]
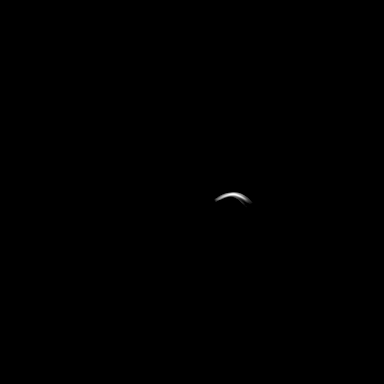

[Series 10: T2 · axial · 5.0mm · 0.53mm/px · 1 of 25 slices shown]
[im 1/25]
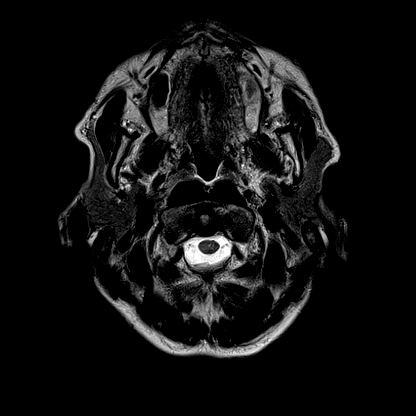

[Series 12: pha_images · axial · 3.0mm · 0.90mm/px · z∈[-88,+88]mm · 3 of 59 slices shown]
[im 1/59]
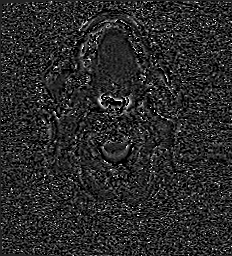
[im 30/59]
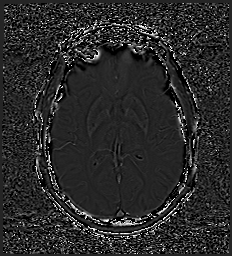
[im 59/59]
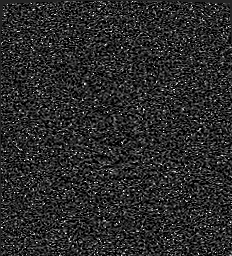

[Series 13: swi_images · axial · 3.0mm · 0.90mm/px · z∈[-88,+88]mm · 3 of 60 slices shown]
[im 1/60]
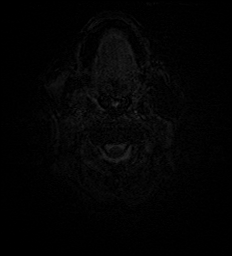
[im 30/60]
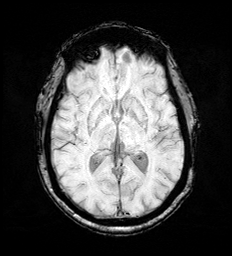
[im 60/60]
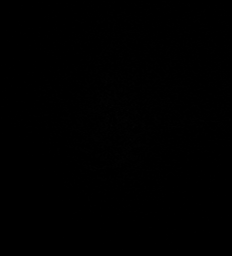

[Series 15: FLAIR · axial · 3.0mm · 0.53mm/px · z∈[-80,+81]mm · 3 of 55 slices shown]
[im 1/55]
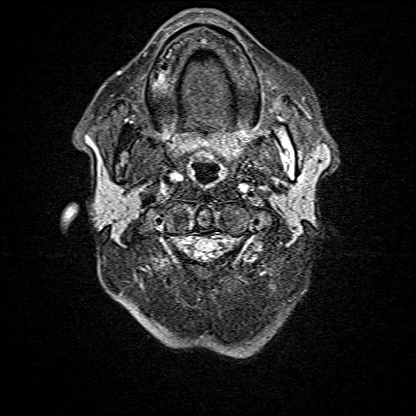
[im 28/55]
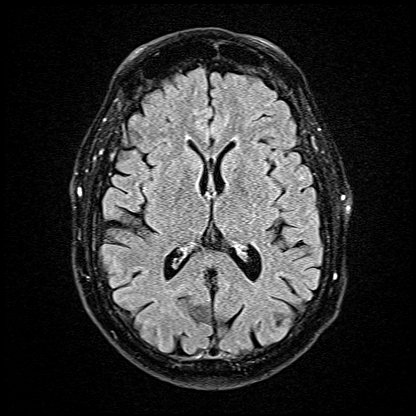
[im 55/55]
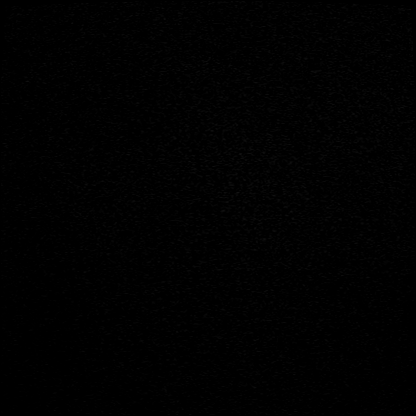

[Series 16: T1 · axial · 1.0mm · 0.98mm/px · z∈[-88,+87]mm · 8 of 176 slices shown (2 of 2)]
[im 1/176]
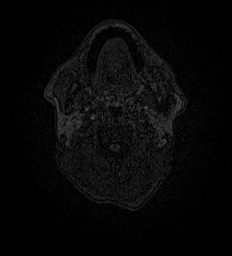
[im 20/176]
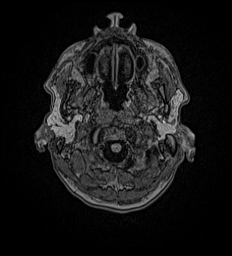
[im 59/176]
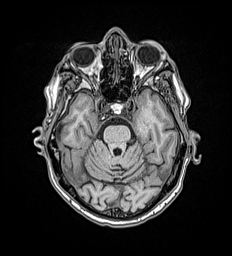
[im 78/176]
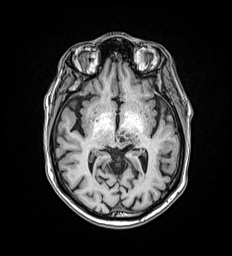
[im 98/176]
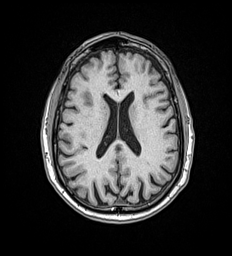
[im 117/176]
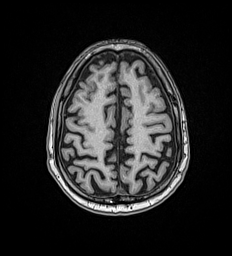
[im 156/176]
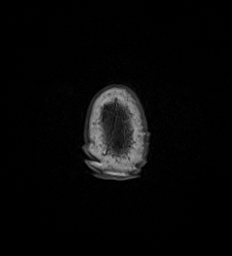
[im 176/176]
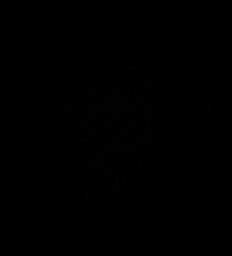

[Series 17: T2 post-contrast · coronal · 5.0mm · 0.57mm/px · 2 of 29 slices shown]
[im 1/29]
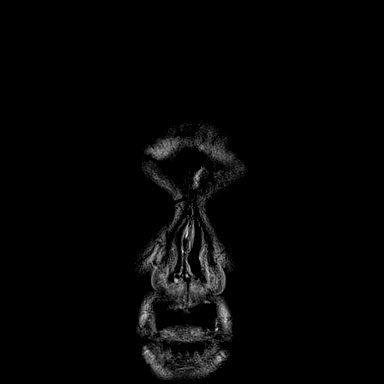
[im 29/29]
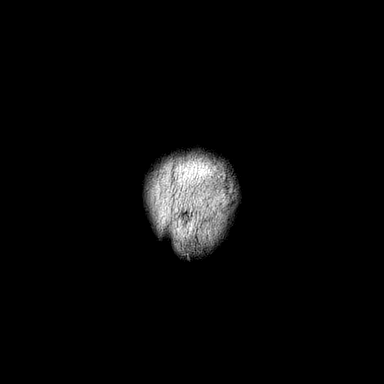

[Series 18: T1 post-contrast · axial · 1.0mm · 0.98mm/px · z∈[-88,+87]mm · 10 of 175 slices shown (1 of 3)]
[im 1/175]
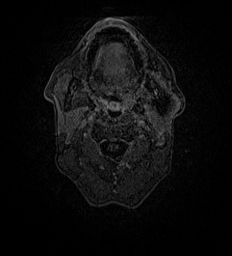
[im 20/175]
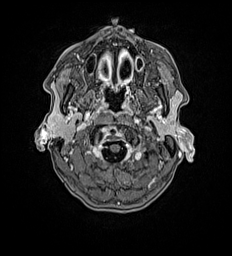
[im 39/175]
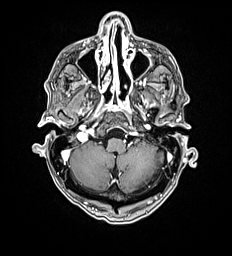
[im 59/175]
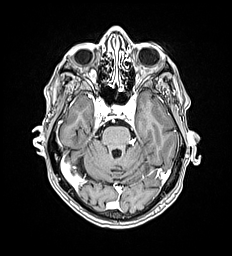
[im 78/175]
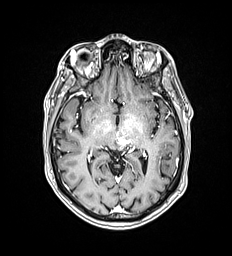
[im 97/175]
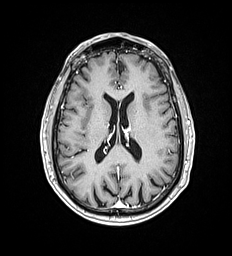
[im 117/175]
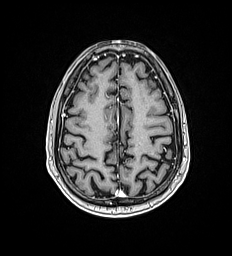
[im 136/175]
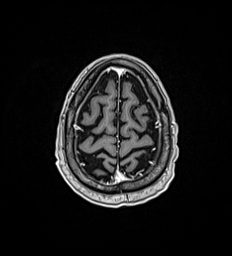
[im 155/175]
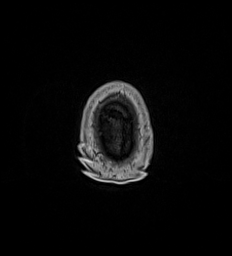
[im 175/175]
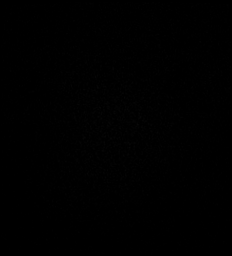

[Series 19: T1 post-contrast · coronal · 5.0mm · 0.57mm/px · 2 of 29 slices shown (2 of 3)]
[im 1/29]
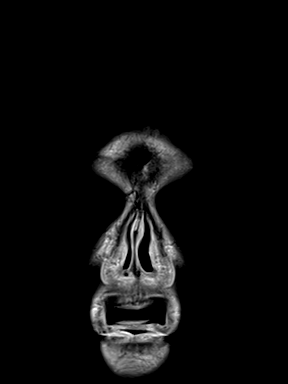
[im 29/29]
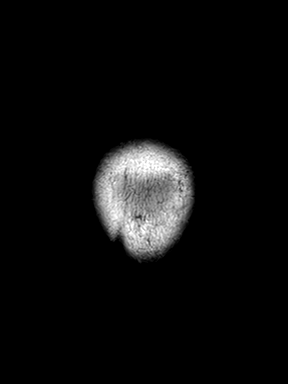

[Series 20: T1 post-contrast · sagittal · 5.0mm · 0.62mm/px · 1 of 25 slices shown (3 of 3)]
[im 1/25]
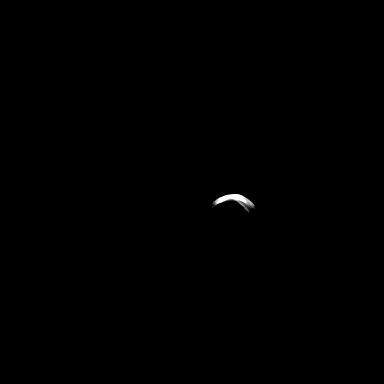

[46 of 48 positions shown; findings below may reference images not displayed]

FINDINGS: Brain: Cerebral volume within normal limits. No significant cerebral
white matter disease for age. No evidence for acute or subacute
infarct. Gray-white matter differentiation maintained. No areas of
chronic cortical infarction.

Prominent vascular structure centered near the left ambient cistern
again seen, corresponding with abnormality on prior CT (series 10,
image 11). Lesion demonstrates a well preserved flow void and
enhancement, with no evidence for thrombosis. Lesion extends into
the adjacent left thalamus, demonstrating a somewhat Archie like
morphology as it courses into the brain parenchyma (series 18, image
85). Appearance is most characteristic of a prominent DVA. Minimal
hazy T2 signal abnormality within the adjacent left thalamus and
midbrain likely reflects a degree of associated steal phenomenon. No
associated susceptibility artifact to suggest blood products on SWI
sequence. This is felt to be an incidental finding, and of doubtful
significance.

No other mass lesion, mass effect or midline shift. No hydrocephalus
or extra-axial fluid collection. Pituitary gland and suprasellar
region within normal limits. Midline structures intact and normal.
No other abnormal enhancement.

Vascular: Major intracranial vascular flow voids are maintained.
Prominent DVA as above.

Skull and upper cervical spine: Craniocervical junction within
normal limits. Bone marrow signal intensity within normal limits. No
scalp soft tissue abnormality.

Sinuses/Orbits: Patient status post bilateral ocular lens
replacement. Globes and orbital soft tissues demonstrate no acute
finding. Chronic mucosal thickening present throughout the ethmoidal
air cells, right sphenoid sinus, and maxillary sinuses. No
significant mastoid effusion. Inner ear structures grossly normal.

Other: None.
IMPRESSION: 1. Prominent venous structure position near the left ambient cistern
as above, most characteristic of a developmental venous anomaly. No
evidence for thrombosis or other complicating feature. This is felt
to likely be an incidental finding and of doubtful clinical
significance.
2. Otherwise normal brain MRI for age. No other acute intracranial
abnormality identified.
